# Patient Record
Sex: Male | Born: 1970 | Race: Black or African American | Hispanic: No | Marital: Single | Smoking: Never smoker
Health system: Southern US, Community
[De-identification: ages and names within clinical notes are randomized; demographics above are authoritative.]

## PROBLEM LIST (undated history)

## (undated) DIAGNOSIS — E785 Hyperlipidemia, unspecified: Secondary | ICD-10-CM

## (undated) DIAGNOSIS — E119 Type 2 diabetes mellitus without complications: Secondary | ICD-10-CM

## (undated) DIAGNOSIS — E7800 Pure hypercholesterolemia, unspecified: Secondary | ICD-10-CM

## (undated) DIAGNOSIS — R7303 Prediabetes: Secondary | ICD-10-CM

## (undated) DIAGNOSIS — I10 Essential (primary) hypertension: Secondary | ICD-10-CM

## (undated) HISTORY — PX: OTHER SURGICAL HISTORY: SHX169

## (undated) HISTORY — PX: BACK SURGERY: SHX140

## (undated) HISTORY — DX: Hyperlipidemia, unspecified: E78.5

## (undated) HISTORY — PX: ANKLE SURGERY: SHX546

---

## 1997-12-15 ENCOUNTER — Emergency Department (HOSPITAL_COMMUNITY): Admission: EM | Admit: 1997-12-15 | Discharge: 1997-12-15 | Payer: Self-pay | Admitting: Emergency Medicine

## 1998-07-11 ENCOUNTER — Encounter: Payer: Self-pay | Admitting: Emergency Medicine

## 1998-07-11 ENCOUNTER — Emergency Department (HOSPITAL_COMMUNITY): Admission: EM | Admit: 1998-07-11 | Discharge: 1998-07-11 | Payer: Self-pay | Admitting: Emergency Medicine

## 2015-01-12 ENCOUNTER — Emergency Department (HOSPITAL_BASED_OUTPATIENT_CLINIC_OR_DEPARTMENT_OTHER): Payer: Self-pay

## 2015-01-12 ENCOUNTER — Encounter (HOSPITAL_BASED_OUTPATIENT_CLINIC_OR_DEPARTMENT_OTHER): Payer: Self-pay | Admitting: *Deleted

## 2015-01-12 ENCOUNTER — Emergency Department (HOSPITAL_BASED_OUTPATIENT_CLINIC_OR_DEPARTMENT_OTHER)
Admission: EM | Admit: 2015-01-12 | Discharge: 2015-01-12 | Disposition: A | Discharge status: Home / Self Care | Payer: Self-pay | Attending: Emergency Medicine | Admitting: Emergency Medicine

## 2015-01-12 DIAGNOSIS — I10 Essential (primary) hypertension: Secondary | ICD-10-CM | POA: Insufficient documentation

## 2015-01-12 DIAGNOSIS — R079 Chest pain, unspecified: Secondary | ICD-10-CM | POA: Insufficient documentation

## 2015-01-12 DIAGNOSIS — Z79899 Other long term (current) drug therapy: Secondary | ICD-10-CM | POA: Insufficient documentation

## 2015-01-12 HISTORY — DX: Essential (primary) hypertension: I10

## 2015-01-12 LAB — CBC
HCT: 44.5 % (ref 39.0–52.0)
Hemoglobin: 14.6 g/dL (ref 13.0–17.0)
MCH: 28 pg (ref 26.0–34.0)
MCHC: 32.8 g/dL (ref 30.0–36.0)
MCV: 85.4 fL (ref 78.0–100.0)
PLATELETS: 273 10*3/uL (ref 150–400)
RBC: 5.21 MIL/uL (ref 4.22–5.81)
RDW: 13.6 % (ref 11.5–15.5)
WBC: 6.3 10*3/uL (ref 4.0–10.5)

## 2015-01-12 LAB — BASIC METABOLIC PANEL
Anion gap: 10 (ref 5–15)
BUN: 10 mg/dL (ref 6–20)
CALCIUM: 8.9 mg/dL (ref 8.9–10.3)
CHLORIDE: 103 mmol/L (ref 101–111)
CO2: 26 mmol/L (ref 22–32)
CREATININE: 1.05 mg/dL (ref 0.61–1.24)
GFR calc Af Amer: >60 mL/min (ref >60)
GFR calc non Af Amer: >60 mL/min (ref >60)
GLUCOSE: 109 mg/dL — AB (ref 65–99)
Potassium: 3.4 mmol/L — ABNORMAL LOW (ref 3.5–5.1)
Sodium: 139 mmol/L (ref 135–145)

## 2015-01-12 LAB — TROPONIN I: Troponin I: <0.03 ng/mL (ref <0.031)

## 2015-01-12 MED ORDER — AMLODIPINE BESYLATE 5 MG PO TABS
10.0000 mg | ORAL_TABLET | Freq: Once | ORAL | Status: AC
  Administered 2015-01-12: 10 mg via ORAL
  Filled 2015-01-12: qty 2

## 2015-01-12 MED ORDER — ASPIRIN 81 MG PO CHEW
324.0000 mg | CHEWABLE_TABLET | Freq: Once | ORAL | Status: AC
  Administered 2015-01-12: 324 mg via ORAL
  Filled 2015-01-12: qty 4

## 2015-01-12 NOTE — ED Provider Notes (Signed)
CSN: [id number]     Arrival date & time 01/12/15  1316 History   First MD Initiated Contact with Patient 01/12/15 1355     Chief Complaint  Patient presents with  . Chest Pain     (Consider location/radiation/quality/duration/timing/severity/associated sxs/prior Treatment) HPI  Pt presenting with c/o chest pain- he states the pain is described as an aching pressure type pain in both right and left chest.  He states the pain is intermittent.  Occurs more after eating and with movement.  No abdominal pain, no nausea associated, no radiation of pain, no diaphoresis.  No shortness of breath.  He states initially the pain began approx 2 months ago when he was throwing steel pipes into a trailer- he states he felt he may have pulled a muscle with the throwing motion. No hx of DVT/PE, no leg swelling. No hx of trauma/travel/surgery.  Chest pain is not associated with exertion he works in the heat and does not regularly feel the symptoms while working.  There are no other associated systemic symptoms, there are no other alleviating or modifying factors.   Past Medical History  Diagnosis Date  . Hypertension    History reviewed. No pertinent past surgical history. No family history on file. Social History  Substance Use Topics  . Smoking status: Never Smoker   . Smokeless tobacco: Never Used  . Alcohol Use: Yes     Comment: 14, 24 oz beers    Review of Systems  ROS reviewed and all otherwise negative except for mentioned in HPI    Allergies  Review of patient's allergies indicates no known allergies.  Home Medications   Prior to Admission medications   Medication Sig Start Date End Date Taking? Authorizing Provider  amLODipine (NORVASC) 10 MG tablet Take 10 mg by mouth daily.   Yes Historical Provider, MD   BP 154/99 mmHg  Pulse 83  Temp(Src) 98.5 F (36.9 C) (Oral)  Resp 15  Ht  (1.803 m)  Wt 245 lb (111.131 kg)  BMI 34.19 kg/m2  SpO2 99%  Vitals reviewed Physical  Exam  Physical Examination: General appearance - alert, well appearing, and in no distress Mental status - alert, oriented to person, place, and time Eyes - no conjunctival injection, no scleral icterus Mouth - mucous membranes moist, pharynx normal without lesions Neck - supple, no significant adenopathy Chest - clear to auscultation, no wheezes, rales or rhonchi, symmetric air entry Heart - normal rate, regular rhythm, normal S1, S2, no murmurs, rubs, clicks or gallops Abdomen - soft, nontender, nondistended, no masses or organomegaly Neurological - alert, oriented, normal speech Extremities - peripheral pulses normal, no pedal edema, no clubbing or cyanosis Skin - normal coloration and turgor, no rashes  ED Course  Procedures (including critical care time) Labs Review Labs Reviewed  BASIC METABOLIC PANEL - Abnormal; Notable for the following:    Potassium 3.4 (*)    Glucose, Bld 109 (*)    All other components within normal limits  CBC  TROPONIN I    Imaging Review Dg Chest 2 View  01/12/2015   CLINICAL DATA:  44 year old male with a history of chest pain.  EXAM: CHEST - 2 VIEW  COMPARISON:  None.  FINDINGS: Cardiomediastinal silhouette projects within normal limits in size and contour. Ill-defined airspace opacity in the right mid to lower lung. No pneumothorax or pleural effusion. Left lung relatively well aerated.  No displaced fracture.  Unremarkable appearance of the upper abdomen.  IMPRESSION: Airspace opacity in  the right mid to lower lung, which, in the absence of symptoms of infection/pneumonia, likely represents atelectasis given the history.  Signed,  Yvone Neu. Loreta Ave, DO  [facility]  [facility]   Electronically Signed   By: Gilmer Mor D.O.   On: 01/12/2015 14:38   I have personally reviewed and evaluated these images and lab results as part of my medical decision-making.   EKG Interpretation   Date/Time:  Sunday  January 12 2015 13:52:16 EDT Ventricular Rate:  95 PR Interval:  148 QRS Duration: 76 QT Interval:  354 QTC Calculation: 444 R Axis:   54 Text Interpretation:  Normal sinus rhythm Biatrial enlargement Abnormal  ECG No old tracing to compare Confirmed by [facility]  MD, MARTHA [phone]) on  01/12/2015 2:18:21 PM      MDM   Final diagnoses:  Essential hypertension  Chest pain, unspecified chest pain type     Pt has a heart score of 1 making him low risk for ACS, EKg and troponin are reassuring.  Will obtain second troponin at 5pm.  Pt PERC 0, making him very low risk for PE.  Pt signed out at end of shift to Dr. Judd Lien pending second troponin at 5pm- if this is negative patient to be discharge.  He was given his home dose of norvasc in the ED as he has not taken today.  He was encouraged to take his BP meds regularly.    Jerelyn Scott, MD 01/12/15 1556

## 2015-01-12 NOTE — Discharge Instructions (Signed)

## 2015-01-12 NOTE — ED Notes (Signed)
Pt reports that on July 5th he was loading a trailer with steel pipes and started experiencing chest pain (no radiation or other associated symptoms). Chest pain has been intermittent ever since then. Denies taking pain meds, fever, n/v/d, sob.

## 2017-12-07 DIAGNOSIS — G4730 Sleep apnea, unspecified: Secondary | ICD-10-CM | POA: Insufficient documentation

## 2017-12-07 HISTORY — DX: Sleep apnea, unspecified: G47.30

## 2018-01-30 ENCOUNTER — Encounter (HOSPITAL_BASED_OUTPATIENT_CLINIC_OR_DEPARTMENT_OTHER): Payer: Self-pay

## 2018-01-30 ENCOUNTER — Emergency Department (HOSPITAL_BASED_OUTPATIENT_CLINIC_OR_DEPARTMENT_OTHER): Payer: Self-pay

## 2018-01-30 ENCOUNTER — Emergency Department (HOSPITAL_BASED_OUTPATIENT_CLINIC_OR_DEPARTMENT_OTHER)
Admission: EM | Admit: 2018-01-30 | Discharge: 2018-01-31 | Disposition: A | Discharge status: Home / Self Care | Payer: Self-pay | Attending: Emergency Medicine | Admitting: Emergency Medicine

## 2018-01-30 ENCOUNTER — Other Ambulatory Visit: Payer: Self-pay

## 2018-01-30 DIAGNOSIS — E876 Hypokalemia: Secondary | ICD-10-CM | POA: Insufficient documentation

## 2018-01-30 DIAGNOSIS — R002 Palpitations: Secondary | ICD-10-CM | POA: Insufficient documentation

## 2018-01-30 DIAGNOSIS — Z79899 Other long term (current) drug therapy: Secondary | ICD-10-CM | POA: Insufficient documentation

## 2018-01-30 LAB — D-DIMER, QUANTITATIVE: D-Dimer, Quant: 0.32 ug/mL-FEU (ref 0.00–0.50)

## 2018-01-30 LAB — BASIC METABOLIC PANEL
Anion gap: 12 (ref 5–15)
BUN: 13 mg/dL (ref 6–20)
CO2: 23 mmol/L (ref 22–32)
CREATININE: 0.91 mg/dL (ref 0.61–1.24)
Calcium: 8.9 mg/dL (ref 8.9–10.3)
Chloride: 103 mmol/L (ref 98–111)
GFR calc Af Amer: >60 mL/min (ref >60)
GFR calc non Af Amer: >60 mL/min (ref >60)
Glucose, Bld: 134 mg/dL — ABNORMAL HIGH (ref 70–99)
Potassium: 3.3 mmol/L — ABNORMAL LOW (ref 3.5–5.1)
SODIUM: 138 mmol/L (ref 135–145)

## 2018-01-30 LAB — TROPONIN I: Troponin I: <0.03 ng/mL (ref <0.03)

## 2018-01-30 LAB — CBC
HCT: 44.3 % (ref 39.0–52.0)
Hemoglobin: 14.6 g/dL (ref 13.0–17.0)
MCH: 28.9 pg (ref 26.0–34.0)
MCHC: 33 g/dL (ref 30.0–36.0)
MCV: 87.5 fL (ref 78.0–100.0)
PLATELETS: 228 10*3/uL (ref 150–400)
RBC: 5.06 MIL/uL (ref 4.22–5.81)
RDW: 13.9 % (ref 11.5–15.5)
WBC: 5.8 10*3/uL (ref 4.0–10.5)

## 2018-01-30 MED ORDER — POTASSIUM CHLORIDE CRYS ER 20 MEQ PO TBCR
40.0000 meq | EXTENDED_RELEASE_TABLET | Freq: Once | ORAL | Status: AC
  Administered 2018-01-30: 40 meq via ORAL
  Filled 2018-01-30: qty 2

## 2018-01-30 NOTE — ED Triage Notes (Signed)
Left side chest pain since this morning, started after he was walking around the neighborhood, denies SOB, denies nausea, denies jaw pain

## 2018-01-30 NOTE — ED Provider Notes (Signed)
[facility] EMERGENCY DEPARTMENT Provider Note   CSN: [id number] Arrival date & time: 01/30/18  2144     History   Chief Complaint Chief Complaint  Patient presents with  . Chest Pain    HPI VINH BLOOM is a 47 y.o. male.  HPI  This is a 47 year old male with a history of hypertension who presents with palpitations.  Patient reports that he normally takes walks in the morning.  This morning he was out walking when he had onset of palpitations.  Denies any pain but states he felt like his heart was fluttering.  This lasts for several minutes and subsided.  No associated nausea or shortness of breath.  It resolved on its own.  He states throughout the day he has noted intermittent recurrent episodes throughout the day.  Not associated with exertion or eating.  Denies any recent major changes in diet, medications, denies supplements.  Does report "my mom has some sort of fluttering in her heart."  Denies any history of diabetes, smoking, early family history of heart disease.  Denies any leg swelling, history of blood clots, recent long travel, recent hospitalization.  Past Medical History:  Diagnosis Date  . Hypertension     There are no active problems to display for this patient.   History reviewed. No pertinent surgical history.      Home Medications    Prior to Admission medications   Medication Sig Start Date End Date Taking? Authorizing Provider  hydrochlorothiazide (HYDRODIURIL) 25 MG tablet Take by mouth. 10/20/17  Yes [provider]  lisinopril (PRINIVIL,ZESTRIL) 20 MG tablet Take by mouth. 11/25/17  Yes [provider]  amLODipine (NORVASC) 10 MG tablet Take 10 mg by mouth daily.    [provider]  potassium chloride SA (K-DUR,KLOR-CON) 20 MEQ tablet Take 2 tablets (40 mEq total) by mouth daily. 01/31/18   Diamond Martucci, Mayer Masker, MD    Family History No family history on file.  Social History Social History   Tobacco Use  .  Smoking status: Never Smoker  . Smokeless tobacco: Never Used  Substance Use Topics  . Alcohol use: Yes    Comment: 14, 24 oz beers  . Drug use: No     Allergies   Patient has no known allergies.   Review of Systems Review of Systems  Constitutional: Negative for fever.  Respiratory: Negative for shortness of breath.   Cardiovascular: Positive for palpitations. Negative for chest pain and leg swelling.  Gastrointestinal: Negative for abdominal pain, nausea and vomiting.  Genitourinary: Negative for dysuria.  All other systems reviewed and are negative.    Physical Exam Updated Vital Signs BP 124/75   Pulse 81   Temp 98.8 F (37.1 C) (Oral)   Resp 16   Ht 1.803 m (5\' 11" )   Wt 113.4 kg   SpO2 98%   BMI 34.87 kg/m   Physical Exam  Constitutional: He is oriented to person, place, and time. He appears well-developed and well-nourished.  Nontoxic-appearing, overweight  HENT:  Head: Normocephalic and atraumatic.  Eyes: Pupils are equal, round, and reactive to light.  Cardiovascular: Normal rate, regular rhythm, normal heart sounds and normal pulses.  No murmur heard. Pulmonary/Chest: Effort normal and breath sounds normal. No respiratory distress. He has no wheezes.  Abdominal: Soft. Bowel sounds are normal. There is no tenderness. There is no rebound.  Musculoskeletal:       Right lower leg: He exhibits edema. He exhibits no tenderness.  Left lower leg: He exhibits edema. He exhibits no tenderness.  1+ symmetric bilateral lower extremity edema  Lymphadenopathy:    He has no cervical adenopathy.  Neurological: He is alert and oriented to person, place, and time.  Skin: Skin is warm and dry.  Psychiatric: He has a normal mood and affect.  Nursing note and vitals reviewed.    ED Treatments / Results  Labs (all labs ordered are listed, but only abnormal results are displayed) Labs Reviewed  BASIC METABOLIC PANEL - Abnormal; Notable for the following  components:      Result Value   Potassium 3.3 (*)    Glucose, Bld 134 (*)    All other components within normal limits  CBC  TROPONIN I  D-DIMER, QUANTITATIVE (NOT AT [facility])    EKG EKG Interpretation  Date/Time:  Monday January 30 2018 21:53:40 EDT Ventricular Rate:  87 PR Interval:  146 QRS Duration: 84 QT Interval:  354 QTC Calculation: 425 R Axis:   50 Text Interpretation:  Normal sinus rhythm Right atrial enlargement Borderline ECG No significant change since last tracing Confirmed by Ross Marcus ([id number]) on 01/30/2018 11:01:02 PM   Radiology Dg Chest 2 View  Result Date: 01/30/2018 CLINICAL DATA:  Chest pain EXAM: CHEST - 2 VIEW COMPARISON:  01/12/2015 FINDINGS: Heart is mildly enlarged. Normal vascularity. Low lung volumes and bibasilar atelectasis. No pneumothorax or pleural effusion. IMPRESSION: Cardiomegaly without decompensation. Electronically Signed   By: Jolaine Click M.D.   On: 01/30/2018 22:52    Procedures Procedures (including critical care time)  Medications Ordered in ED Medications  potassium chloride SA (K-DUR,KLOR-CON) CR tablet 40 mEq (40 mEq Oral Given 01/30/18 2351)     Initial Impression / Assessment and Plan / ED Course  I have reviewed the triage vital signs and the nursing notes.  Pertinent labs & imaging results that were available during my care of the patient were reviewed by me and considered in my medical decision making (see chart for details).     Patient presents with palpitations.  Initially started this morning after walking and intermittent throughout the day.  Denies any overt chest pain or shortness of breath.  He is overall nontoxic-appearing and vital signs are reassuring.  Heart score is 2.  EKG shows no signs of active ischemia.  Troponin is negative.  Chest x-ray shows no evidence of pneumothorax or pneumonia.  He is low risk for PE and has a negative d-dimer which rules him out.  He has had no evidence of arrhythmia while in  the emergency department.  He does have some mild hypokalemia.  This was replaced and he will be supplemented for the next 5 days.  Given intermittent palpitations, would recommend cardiology follow-up for Holter monitoring.  I discussed with the patient.  He is agreeable to plan.  After history, exam, and medical workup I feel the patient has been appropriately medically screened and is safe for discharge home. Pertinent diagnoses were discussed with the patient. Patient was given return precautions.   Final Clinical Impressions(s) / ED Diagnoses   Final diagnoses:  Palpitations  Hypokalemia    ED Discharge Orders         Ordered    potassium chloride SA (K-DUR,KLOR-CON) 20 MEQ tablet  Daily     01/31/18 0023           Shon Baton, MD 01/31/18 [phone]

## 2018-01-31 MED ORDER — POTASSIUM CHLORIDE CRYS ER 20 MEQ PO TBCR: 40.0000 meq | EXTENDED_RELEASE_TABLET | Freq: Every day | ORAL | 0 refills | Status: DC

## 2018-01-31 NOTE — Discharge Instructions (Addendum)
He was seen today for palpitations and fluttering.  Your work-up is reassuring.  Avoid excessive caffeine or drastic changes in diet.  Make sure to take potassium supplements and increase potassium in your diet.  Follow-up with cardiology for Holter monitoring.  If you develop any new or worsening symptoms you should be reevaluated.

## 2018-05-26 ENCOUNTER — Emergency Department (HOSPITAL_BASED_OUTPATIENT_CLINIC_OR_DEPARTMENT_OTHER)
Admission: EM | Admit: 2018-05-26 | Discharge: 2018-05-26 | Disposition: A | Discharge status: Home / Self Care | Payer: Self-pay | Attending: Emergency Medicine | Admitting: Emergency Medicine

## 2018-05-26 ENCOUNTER — Emergency Department (HOSPITAL_BASED_OUTPATIENT_CLINIC_OR_DEPARTMENT_OTHER): Payer: Self-pay

## 2018-05-26 ENCOUNTER — Encounter (HOSPITAL_BASED_OUTPATIENT_CLINIC_OR_DEPARTMENT_OTHER): Payer: Self-pay | Admitting: Emergency Medicine

## 2018-05-26 ENCOUNTER — Other Ambulatory Visit: Payer: Self-pay

## 2018-05-26 DIAGNOSIS — I10 Essential (primary) hypertension: Secondary | ICD-10-CM | POA: Insufficient documentation

## 2018-05-26 DIAGNOSIS — R51 Headache: Secondary | ICD-10-CM | POA: Insufficient documentation

## 2018-05-26 DIAGNOSIS — R410 Disorientation, unspecified: Secondary | ICD-10-CM | POA: Insufficient documentation

## 2018-05-26 DIAGNOSIS — Z79899 Other long term (current) drug therapy: Secondary | ICD-10-CM | POA: Insufficient documentation

## 2018-05-26 DIAGNOSIS — R519 Headache, unspecified: Secondary | ICD-10-CM
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
## 2018-05-26 NOTE — ED Triage Notes (Signed)
Pt states he had a pain in his face about 20 minutes ago  Pt denies pain at this time but states his forehead feels tingly  Pt says he has never felt anything like that before

## 2018-05-26 NOTE — ED Notes (Signed)
Pt verbalizes understanding of d/c instructions and denies any further needs at this time. 

## 2018-05-26 NOTE — ED Notes (Signed)
Patient transported to CT 

## 2019-05-30 ENCOUNTER — Other Ambulatory Visit: Payer: Self-pay

## 2019-05-30 DIAGNOSIS — Z20822 Contact with and (suspected) exposure to covid-19: Secondary | ICD-10-CM

## 2019-06-01 LAB — NOVEL CORONAVIRUS, NAA: SARS-CoV-2, NAA: DETECTED — AB

## 2019-06-02 ENCOUNTER — Telehealth: Payer: Self-pay | Admitting: Adult Health

## 2019-06-02 NOTE — Telephone Encounter (Signed)
Patient called about Positive Covid test.  2 patient identifiers confirmed.  Date Tested: 05/29/2018  Date of Symptom onset: 05/27/2018   Symptoms: Mild Mild--Fever greater than 100.4, fatigue, body aches, dry cough, and DOE  Isolation Recommendations:  Patient understands the needs to stay in isolation for a total of 10 days from onset of symptom or 14 days total from date of testing if no symptom. Reviewed Masking.    Supportive Care Recommendations: Encouraged plenty of fluid intake, anti-pyretics per package directions, and to remain as active as possible.    Patient knows the health department may be in touch.    I answered all of patient's questions and all concerns addressed.  Gave information on My chart.    Time Spent: 5 minutes on phone  Lillard Anes, NP

## 2019-06-04 ENCOUNTER — Encounter (HOSPITAL_BASED_OUTPATIENT_CLINIC_OR_DEPARTMENT_OTHER): Payer: Self-pay | Admitting: Emergency Medicine

## 2019-06-04 ENCOUNTER — Inpatient Hospital Stay (HOSPITAL_BASED_OUTPATIENT_CLINIC_OR_DEPARTMENT_OTHER)
Admission: EM | Admit: 2019-06-04 | Discharge: 2019-06-06 | DRG: 177 | Disposition: A | Discharge status: Home / Self Care | Payer: HRSA Program | Attending: Internal Medicine | Admitting: Internal Medicine

## 2019-06-04 ENCOUNTER — Other Ambulatory Visit: Payer: Self-pay

## 2019-06-04 ENCOUNTER — Emergency Department (HOSPITAL_BASED_OUTPATIENT_CLINIC_OR_DEPARTMENT_OTHER): Payer: HRSA Program

## 2019-06-04 DIAGNOSIS — U071 COVID-19: Secondary | ICD-10-CM | POA: Diagnosis present

## 2019-06-04 DIAGNOSIS — Z8249 Family history of ischemic heart disease and other diseases of the circulatory system: Secondary | ICD-10-CM | POA: Diagnosis not present

## 2019-06-04 DIAGNOSIS — I10 Essential (primary) hypertension: Secondary | ICD-10-CM | POA: Diagnosis present

## 2019-06-04 DIAGNOSIS — J9601 Acute respiratory failure with hypoxia: Secondary | ICD-10-CM | POA: Diagnosis present

## 2019-06-04 DIAGNOSIS — J1282 Pneumonia due to coronavirus disease 2019: Secondary | ICD-10-CM | POA: Diagnosis present

## 2019-06-04 DIAGNOSIS — E662 Morbid (severe) obesity with alveolar hypoventilation: Secondary | ICD-10-CM | POA: Diagnosis present

## 2019-06-04 DIAGNOSIS — Z6834 Body mass index (BMI) 34.0-34.9, adult: Secondary | ICD-10-CM | POA: Diagnosis not present

## 2019-06-04 DIAGNOSIS — Z79899 Other long term (current) drug therapy: Secondary | ICD-10-CM

## 2019-06-04 HISTORY — DX: Morbid (severe) obesity with alveolar hypoventilation: E66.2

## 2019-06-04 HISTORY — DX: COVID-19: U07.1

## 2019-06-04 LAB — COMPREHENSIVE METABOLIC PANEL
ALT: 37 U/L (ref 0–44)
AST: 39 U/L (ref 15–41)
Albumin: 3.9 g/dL (ref 3.5–5.0)
Alkaline Phosphatase: 48 U/L (ref 38–126)
Anion gap: 11 (ref 5–15)
BUN: 16 mg/dL (ref 6–20)
CO2: 25 mmol/L (ref 22–32)
Calcium: 8.7 mg/dL — ABNORMAL LOW (ref 8.9–10.3)
Chloride: 97 mmol/L — ABNORMAL LOW (ref 98–111)
Creatinine, Ser: 1.06 mg/dL (ref 0.61–1.24)
GFR calc Af Amer: >60 mL/min (ref >60)
GFR calc non Af Amer: >60 mL/min (ref >60)
Glucose, Bld: 113 mg/dL — ABNORMAL HIGH (ref 70–99)
Potassium: 3.7 mmol/L (ref 3.5–5.1)
Sodium: 133 mmol/L — ABNORMAL LOW (ref 135–145)
Total Bilirubin: 0.8 mg/dL (ref 0.3–1.2)
Total Protein: 8.3 g/dL — ABNORMAL HIGH (ref 6.5–8.1)

## 2019-06-04 LAB — CBC WITH DIFFERENTIAL/PLATELET
Abs Immature Granulocytes: 0.03 10*3/uL (ref 0.00–0.07)
Basophils Absolute: 0 10*3/uL (ref 0.0–0.1)
Basophils Relative: 0 %
Eosinophils Absolute: 0 10*3/uL (ref 0.0–0.5)
Eosinophils Relative: 0 %
HCT: 39.5 % (ref 39.0–52.0)
Hemoglobin: 12.7 g/dL — ABNORMAL LOW (ref 13.0–17.0)
Immature Granulocytes: 1 %
Lymphocytes Relative: 33 %
Lymphs Abs: 1.8 10*3/uL (ref 0.7–4.0)
MCH: 28 pg (ref 26.0–34.0)
MCHC: 32.2 g/dL (ref 30.0–36.0)
MCV: 87 fL (ref 80.0–100.0)
Monocytes Absolute: 0.6 10*3/uL (ref 0.1–1.0)
Monocytes Relative: 10 %
Neutro Abs: 3 10*3/uL (ref 1.7–7.7)
Neutrophils Relative %: 56 %
Platelets: 241 10*3/uL (ref 150–400)
RBC: 4.54 MIL/uL (ref 4.22–5.81)
RDW: 12.8 % (ref 11.5–15.5)
WBC: 5.4 10*3/uL (ref 4.0–10.5)
nRBC: 0 % (ref 0.0–0.2)

## 2019-06-04 LAB — D-DIMER, QUANTITATIVE: D-Dimer, Quant: 0.5 ug/mL-FEU (ref 0.00–0.50)

## 2019-06-04 LAB — PROCALCITONIN: Procalcitonin: <0.1 ng/mL

## 2019-06-04 LAB — C-REACTIVE PROTEIN: CRP: 11.2 mg/dL — ABNORMAL HIGH (ref <1.0)

## 2019-06-04 LAB — LACTIC ACID, PLASMA: Lactic Acid, Venous: 1 mmol/L (ref 0.5–1.9)

## 2019-06-04 LAB — TRIGLYCERIDES: Triglycerides: 102 mg/dL (ref <150)

## 2019-06-04 LAB — LACTATE DEHYDROGENASE: LDH: 256 U/L — ABNORMAL HIGH (ref 98–192)

## 2019-06-04 LAB — FERRITIN: Ferritin: 370 ng/mL — ABNORMAL HIGH (ref 24–336)

## 2019-06-04 MED ORDER — HYDROCOD POLST-CPM POLST ER 10-8 MG/5ML PO SUER: 5.0000 mL | Freq: Two times a day (BID) | ORAL | Status: DC | PRN

## 2019-06-04 MED ORDER — ASCORBIC ACID 500 MG PO TABS
500.0000 mg | ORAL_TABLET | Freq: Every day | ORAL | Status: DC
  Administered 2019-06-05 – 2019-06-06 (×2): 500 mg via ORAL
  Filled 2019-06-04 (×2): qty 1

## 2019-06-04 MED ORDER — SODIUM CHLORIDE 0.9 % IV SOLN
100.0000 mg | INTRAVENOUS | Status: AC
  Administered 2019-06-04 (×2): 100 mg via INTRAVENOUS
  Filled 2019-06-04: qty 20

## 2019-06-04 MED ORDER — DOCUSATE SODIUM 100 MG PO CAPS
100.0000 mg | ORAL_CAPSULE | Freq: Every day | ORAL | Status: DC
  Administered 2019-06-05 – 2019-06-06 (×2): 100 mg via ORAL
  Filled 2019-06-04 (×2): qty 1

## 2019-06-04 MED ORDER — DEXAMETHASONE 6 MG PO TABS
6.0000 mg | ORAL_TABLET | Freq: Every day | ORAL | Status: DC
  Administered 2019-06-05 – 2019-06-06 (×2): 6 mg via ORAL
  Filled 2019-06-04 (×2): qty 1

## 2019-06-04 MED ORDER — ENOXAPARIN SODIUM 60 MG/0.6ML ~~LOC~~ SOLN
55.0000 mg | Freq: Every day | SUBCUTANEOUS | Status: DC
  Administered 2019-06-05 (×2): 55 mg via SUBCUTANEOUS
  Filled 2019-06-04: qty 0.6

## 2019-06-04 MED ORDER — ZINC SULFATE 220 (50 ZN) MG PO CAPS
220.0000 mg | ORAL_CAPSULE | Freq: Every day | ORAL | Status: DC
  Administered 2019-06-05 – 2019-06-06 (×2): 220 mg via ORAL
  Filled 2019-06-04 (×2): qty 1

## 2019-06-04 MED ORDER — ACETAMINOPHEN 325 MG PO TABS: 650.0000 mg | ORAL_TABLET | Freq: Four times a day (QID) | ORAL | Status: DC | PRN

## 2019-06-04 MED ORDER — IPRATROPIUM-ALBUTEROL 20-100 MCG/ACT IN AERS
1.0000 | INHALATION_SPRAY | Freq: Four times a day (QID) | RESPIRATORY_TRACT | Status: DC
  Administered 2019-06-05 – 2019-06-06 (×6): 1 via RESPIRATORY_TRACT
  Filled 2019-06-04: qty 4

## 2019-06-04 MED ORDER — SODIUM CHLORIDE 0.9 % IV SOLN
100.0000 mg | Freq: Every day | INTRAVENOUS | Status: DC
  Administered 2019-06-05 – 2019-06-06 (×2): 100 mg via INTRAVENOUS
  Filled 2019-06-04 (×3): qty 20

## 2019-06-04 MED ORDER — GUAIFENESIN-DM 100-10 MG/5ML PO SYRP: 10.0000 mL | ORAL_SOLUTION | ORAL | Status: DC | PRN

## 2019-06-04 MED ORDER — DEXAMETHASONE 6 MG PO TABS
6.0000 mg | ORAL_TABLET | Freq: Once | ORAL | Status: AC
  Administered 2019-06-04: 6 mg via ORAL
  Filled 2019-06-04: qty 1

## 2019-06-04 NOTE — ED Notes (Signed)
Attempted report once again to [phone], not able to leave message. Will attempt different number to call report.
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
## 2019-06-04 NOTE — ED Notes (Signed)
Attempted report to floor nurse, unable to leave message. Will attempt report once again.

## 2019-06-05 ENCOUNTER — Encounter (HOSPITAL_COMMUNITY): Payer: Self-pay | Admitting: Internal Medicine

## 2019-06-05 DIAGNOSIS — J9601 Acute respiratory failure with hypoxia: Secondary | ICD-10-CM

## 2019-06-05 DIAGNOSIS — U071 COVID-19: Principal | ICD-10-CM

## 2019-06-05 LAB — CBC WITH DIFFERENTIAL/PLATELET
Abs Immature Granulocytes: 0.03 10*3/uL (ref 0.00–0.07)
Basophils Absolute: 0 10*3/uL (ref 0.0–0.1)
Basophils Relative: 0 %
Eosinophils Absolute: 0 10*3/uL (ref 0.0–0.5)
Eosinophils Relative: 0 %
HCT: 40.5 % (ref 39.0–52.0)
Hemoglobin: 12.8 g/dL — ABNORMAL LOW (ref 13.0–17.0)
Immature Granulocytes: 1 %
Lymphocytes Relative: 38 %
Lymphs Abs: 1.9 10*3/uL (ref 0.7–4.0)
MCH: 27.8 pg (ref 26.0–34.0)
MCHC: 31.6 g/dL (ref 30.0–36.0)
MCV: 87.9 fL (ref 80.0–100.0)
Monocytes Absolute: 0.3 10*3/uL (ref 0.1–1.0)
Monocytes Relative: 7 %
Neutro Abs: 2.8 10*3/uL (ref 1.7–7.7)
Neutrophils Relative %: 54 %
Platelets: 282 10*3/uL (ref 150–400)
RBC: 4.61 MIL/uL (ref 4.22–5.81)
RDW: 13.2 % (ref 11.5–15.5)
WBC: 5.1 10*3/uL (ref 4.0–10.5)
nRBC: 0 % (ref 0.0–0.2)

## 2019-06-05 LAB — COMPREHENSIVE METABOLIC PANEL
ALT: 60 U/L — ABNORMAL HIGH (ref 0–44)
AST: 63 U/L — ABNORMAL HIGH (ref 15–41)
Albumin: 3.9 g/dL (ref 3.5–5.0)
Alkaline Phosphatase: 55 U/L (ref 38–126)
Anion gap: 14 (ref 5–15)
BUN: 16 mg/dL (ref 6–20)
CO2: 25 mmol/L (ref 22–32)
Calcium: 8.8 mg/dL — ABNORMAL LOW (ref 8.9–10.3)
Chloride: 96 mmol/L — ABNORMAL LOW (ref 98–111)
Creatinine, Ser: 0.81 mg/dL (ref 0.61–1.24)
GFR calc Af Amer: >60 mL/min (ref >60)
GFR calc non Af Amer: >60 mL/min (ref >60)
Glucose, Bld: 123 mg/dL — ABNORMAL HIGH (ref 70–99)
Potassium: 4.1 mmol/L (ref 3.5–5.1)
Sodium: 135 mmol/L (ref 135–145)
Total Bilirubin: 0.7 mg/dL (ref 0.3–1.2)
Total Protein: 8.6 g/dL — ABNORMAL HIGH (ref 6.5–8.1)

## 2019-06-05 LAB — HIV ANTIBODY (ROUTINE TESTING W REFLEX): HIV Screen 4th Generation wRfx: NONREACTIVE

## 2019-06-05 LAB — C-REACTIVE PROTEIN: CRP: 8.6 mg/dL — ABNORMAL HIGH (ref <1.0)

## 2019-06-05 LAB — FERRITIN: Ferritin: 392 ng/mL — ABNORMAL HIGH (ref 24–336)

## 2019-06-05 LAB — D-DIMER, QUANTITATIVE: D-Dimer, Quant: 0.47 ug/mL-FEU (ref 0.00–0.50)

## 2019-06-05 LAB — FIBRINOGEN: Fibrinogen: 757 mg/dL — ABNORMAL HIGH (ref 210–475)

## 2019-06-05 MED ORDER — AMLODIPINE BESYLATE 10 MG PO TABS
10.0000 mg | ORAL_TABLET | Freq: Every day | ORAL | Status: DC
  Administered 2019-06-05 – 2019-06-06 (×2): 10 mg via ORAL
  Filled 2019-06-05 (×2): qty 1

## 2019-06-05 MED ORDER — HYDROCHLOROTHIAZIDE 25 MG PO TABS
25.0000 mg | ORAL_TABLET | Freq: Every day | ORAL | Status: DC
  Administered 2019-06-05 – 2019-06-06 (×2): 25 mg via ORAL
  Filled 2019-06-05 (×2): qty 1

## 2019-06-05 MED ORDER — LISINOPRIL 20 MG PO TABS
20.0000 mg | ORAL_TABLET | Freq: Every day | ORAL | Status: DC
  Administered 2019-06-05 – 2019-06-06 (×2): 20 mg via ORAL
  Filled 2019-06-05 (×2): qty 1

## 2019-06-05 MED ORDER — ATORVASTATIN CALCIUM 10 MG PO TABS
20.0000 mg | ORAL_TABLET | Freq: Every day | ORAL | Status: DC
  Administered 2019-06-05 – 2019-06-06 (×3): 20 mg via ORAL
  Filled 2019-06-05 (×2): qty 2

## 2019-06-05 NOTE — Progress Notes (Signed)
SATURATION QUALIFICATIONS: (This note is used to comply with regulatory documentation for home oxygen)  Patient Saturations on Room Air at Rest = 96 %  Patient Saturations on Room Air while Ambulating = 92 %  

## 2019-06-05 NOTE — Progress Notes (Signed)
Pt declined family notification at this time.

## 2019-06-05 NOTE — Progress Notes (Signed)
PROGRESS NOTE  Brief Narrative: David Wise is a 49 y.o. male with a history of obesity, HTN who presented to [facility] ED with shortness of breath. He was working with someone who was not wearing a mask and had a cough about a week prior to developing a dry cough and fever and testing positive for covid-19 with PCR 1/6. Due to dyspnea, he came to the ED 1/11 where he was found to be hypoxic requiring 2L O2 at rest. CXR showed multifocal infiltrates. CRP elevated at 11.2, with normal PCT and d-dimer. Remdesivir and decadron were given and the patient ultimately transferred for admission to [facility] this morning.   Subjective: Reports significant improvement in dyspnea, still some cough. Walked in the hall with limited dyspnea and SpO2 nadir 88%. He has no other complaints, no abd pain.   Objective: BP 133/84 (BP Location: Right Arm)   Pulse 78   Temp 98.1 F (36.7 C) (Oral)   Resp 19   Ht 5\' 11"  (1.803 m)   Wt 112 kg   SpO2 93%   BMI 34.45 kg/m   Gen: Obese, pleasant male in no distress Pulm: Clear and nonlabored on 2L O2.   CV: RRR, no murmur, no JVD, no edema GI: Soft, NT, ND, +BS  Neuro: Alert and oriented. No focal deficits. Skin: No rashes, lesions or ulcers on visualized skin.  Assessment & Plan: Acute hypoxemic respiratory failure due to covid-19 pneumonia: SARS-CoV-2 PCR positive 1/6, in 2nd week of illness with elevated inflammatory markers and CXR infiltrates. CRP remains >7.  - Requires remdesivir x5 days (1/11 - 1/15). Will need LFT monitoring with slight bump in ALT noted today. Recheck CRP and ALT in AM. If ALT not grossly elevated in AM, will repeat ambulatory pulse oximetry and likely discharge 1/13 with plans to return for remdesivir in infusion clinic.  - Steroids x10 days - Vitamin C, zinc - Encourage OOB, IS - Tylenol and antitussives prn - Continue airborne, contact precautions while admitted. Isolation period would be recommended for 21 days now that he has been admitted  to the hospital. - Enoxaparin weight-based prophylactic dose.  - Maintain euvolemia/net negative.  - Avoid NSAIDs   HTN: Continue home medications  2/13, MD Pager on amion 06/05/2019, 10:14 AM

## 2019-06-05 NOTE — Plan of Care (Signed)
No acute events during this shift. Pt on 2LNC. Pt is ambulatory. VSS   Problem: Education: Goal: Knowledge of risk factors and measures for prevention of condition will improve 06/05/2019 0452 by Colin Benton, Abir Eroh, RN Outcome: Progressing 06/05/2019 0451 by Colin Benton, Sibel Khurana, RN Outcome: Progressing   Problem: Coping: Goal: Psychosocial and spiritual needs will be supported 06/05/2019 0452 by Colin Benton, Shayana Hornstein, RN Outcome: Progressing 06/05/2019 0451 by Helene Shoe, RN Outcome: Progressing   Problem: Respiratory: Goal: Will maintain a patent airway 06/05/2019 0452 by Helene Shoe, RN Outcome: Progressing 06/05/2019 0451 by Helene Shoe, RN Outcome: Progressing Goal: Complications related to the disease process, condition or treatment will be avoided or minimized 06/05/2019 0452 by Colin Benton, Klaryssa Fauth, RN Outcome: Progressing 06/05/2019 0451 by Helene Shoe, RN Outcome: Progressing

## 2019-06-06 LAB — ALT: ALT: 76 U/L — ABNORMAL HIGH (ref 0–44)

## 2019-06-06 LAB — C-REACTIVE PROTEIN: CRP: 4.1 mg/dL — ABNORMAL HIGH (ref <1.0)

## 2019-06-06 MED ORDER — PREDNISONE 10 MG PO TABS: ORAL_TABLET | ORAL | 0 refills | Status: AC

## 2019-06-06 NOTE — Progress Notes (Signed)
D/c instructions provided to pt. Pt follow up appointments for infusion clinic highlighted and explained to pt. He verbalizes understanding. All questions answered, pt has no other questions at this time.  Pt awaiting for ride.

## 2019-06-06 NOTE — Progress Notes (Signed)
Patient scheduled for outpatient Remdesivir infusion at 1PM on Thursday 1/14, and Friday 1/15.  Please advise them to report to [facility] at [address].  Drive to the security guard and tell them you are here for an infusion. They will direct you to the front entrance where we will come and get you.  For questions call [phone].  Thanks

## 2019-06-06 NOTE — Discharge Summary (Signed)
Physician Discharge Summary  David Wise KVQ:[id number] DOB: 1971-03-11 DOA: 06/04/2019  PCP: Patient, No Pcp Per  Admit date: 06/04/2019 Discharge date: 06/06/2019  Admitted From: Home Disposition: Home  Recommendations for Outpatient Follow-up:  1. Follow up with PCP in 1-2 weeks 2. Please obtain BMP/CBC in one week  Discharge Condition: Stable CODE STATUS: Full Diet recommendation: As tolerated  Brief/Interim Summary: David Wise is a 49 y.o. male with a history of obesity, HTN who presented to [facility] ED with shortness of breath. He was working with someone who was not wearing a mask and had a cough about a week prior to developing a dry cough and fever and testing positive for covid-19 with PCR 1/6. Due to dyspnea, he came to the ED 1/11 where he was found to be hypoxic requiring 2L O2 at rest. CXR showed multifocal infiltrates. CRP elevated at 11.2, with normal PCT and d-dimer.  Patient admitted as above with acute hypoxic respiratory failure noted to be COVID-19 positive.  Patient's labs continue downtrending, no longer requiring oxygen.  Patient does need an additional 2 doses of remdesivir, and is currently being set up to follow with the outpatient infusion center for doses on 06/07/2019 and 06/08/2019.  Patient otherwise stable and agreeable for discharge.  Will discharge on remainder of steroid taper as well as encouraged continued quarantine for 21 days as discussed from Covid swab.  Patient otherwise feels quite well back to baseline and is requesting discharge home which is certainly reasonable given resolution of symptoms and current improvement.  Discharge Diagnoses:  Principal Problem:   Acute hypoxemic respiratory failure due to severe acute respiratory syndrome coronavirus 2 (SARS-CoV-2) disease (HCC) Active Problems:   Pneumonia due to COVID-19 virus   Obesity hypoventilation syndrome (HCC)   Essential hypertension  Discharge Instructions   Allergies as of 06/06/2019    No Known Allergies     Medication List    STOP taking these medications   potassium chloride SA 20 MEQ tablet Commonly known as: KLOR-CON     TAKE these medications   amLODipine 10 MG tablet Commonly known as: NORVASC Take 10 mg by mouth daily.   atorvastatin 20 MG tablet Commonly known as: LIPITOR Take 20 mg by mouth daily.   hydrochlorothiazide 25 MG tablet Commonly known as: HYDRODIURIL Take 25 mg by mouth daily.   lisinopril 20 MG tablet Commonly known as: ZESTRIL Take 20 mg by mouth daily.   predniSONE 10 MG tablet Commonly known as: DELTASONE Take 4 tablets (40 mg total) by mouth daily for 3 days, THEN 3 tablets (30 mg total) daily for 3 days, THEN 2 tablets (20 mg total) daily for 3 days, THEN 1 tablet (10 mg total) daily for 3 days. Start taking on: June 06, 2019       No Known Allergies   Procedures/Studies: DG Chest Portable 1 View  Result Date: 06/04/2019 CLINICAL DATA:  COVID-19 positive with cough and fever EXAM: PORTABLE CHEST 1 VIEW COMPARISON:  January 30, 2018 FINDINGS: There is ill-defined airspace opacity throughout each lower lobe and right mid lung region. There is no frank consolidation. Heart is upper normal in size with pulmonary vascularity normal. No adenopathy. No bone lesions. IMPRESSION: Areas of ill-defined airspace opacity bilaterally, somewhat more on the right than on the left, likely representing multifocal atypical organism pneumonia. No consolidation. Stable cardiac silhouette. No adenopathy. Electronically Signed   By: Bretta Bang III M.D.   On: 06/04/2019 12:29    Subjective: No  acute issues or events overnight   Discharge Exam: Vitals:   06/06/19 0500 06/06/19 0705  BP:  122/88  Pulse: 77 77  Resp:  18  Temp:  98.1 F (36.7 C)  SpO2: 97% 97%   Vitals:   06/06/19 0300 06/06/19 0400 06/06/19 0500 06/06/19 0705  BP:  117/80  122/88  Pulse: 72 73 77 77  Resp:  16  18  Temp:  97.6 F (36.4 C)  98.1 F (36.7  C)  TempSrc:  Oral  Oral  SpO2: 98% 99% 97% 97%  Weight:      Height:        General:  Pleasantly resting in bed, No acute distress. HEENT:  Normocephalic atraumatic.  Sclerae nonicteric, noninjected.  Extraocular movements intact bilaterally. Neck:  Without mass or deformity.  Trachea is midline. Lungs:  Clear to auscultate bilaterally without rhonchi, wheeze, or rales. Heart:  Regular rate and rhythm.  Without murmurs, rubs, or gallops. Abdomen:  Soft, nontender, nondistended.  Without guarding or rebound. Extremities: Without cyanosis, clubbing, edema, or obvious deformity. Vascular:  Dorsalis pedis and posterior tibial pulses palpable bilaterally. Skin:  Warm and dry, no erythema, no ulcerations.   The results of significant diagnostics from this hospitalization (including imaging, microbiology, ancillary and laboratory) are listed below for reference.     Microbiology: Recent Results (from the past 240 hour(s))  Novel Coronavirus, NAA ([facility])     Status: Abnormal   Collection Time: 05/30/19  3:02 PM   Specimen: Nasopharyngeal(NP) swabs in vial transport medium   NASOPHARYNGE  SCREENIN  Result Value Ref Range Status   SARS-CoV-2, NAA Detected (A) Not Detected Final    Comment: This nucleic acid amplification test was developed and its performance characteristics determined by [employer]. Nucleic acid amplification tests include PCR and TMA. This test has not been FDA cleared or approved. This test has been authorized by FDA under an Emergency Use Authorization (EUA). This test is only authorized for the duration of time the declaration that circumstances exist justifying the authorization of the emergency use of in vitro diagnostic tests for detection of SARS-CoV-2 virus and/or diagnosis of COVID-19 infection under section 564(b)(1) of the Act, 21 U.S.C. 299MEQ-6(S) (1), unless the authorization is terminated or revoked sooner. When diagnostic testing is  negative, the possibility of a false negative result should be considered in the context of a patient's recent exposures and the presence of clinical signs and symptoms consistent with COVID-19. An individual without symptoms of COVID-19 and who is not shedding SARS-CoV-2 virus would  expect to have a negative (not detected) result in this assay.   Blood Culture (routine x 2)     Status: None (Preliminary result)   Collection Time: 06/04/19 12:37 PM   Specimen: BLOOD  Result Value Ref Range Status   Specimen Description   Final    BLOOD LEFT ANTECUBITAL Performed at [facility], [address] [place], [place] [address]    Special Requests   Final    BOTTLES DRAWN AEROBIC AND ANAEROBIC Blood Culture adequate volume Performed at [facility], [address] [place], [place] [address]    Culture   Final    NO GROWTH 2 DAYS Performed at [facility], [address] [place], [place] [address]    Report Status PENDING  Incomplete  Blood Culture (routine x 2)     Status: None (Preliminary result)   Collection Time: 06/04/19 12:41 PM   Specimen:  BLOOD  Result Value Ref Range Status   Specimen Description   Final    BLOOD RIGHT ANTECUBITAL Performed at [facility], [address] [place], [place] [address]    Special Requests   Final    BOTTLES DRAWN AEROBIC AND ANAEROBIC Blood Culture adequate volume Performed at [facility], [address] [place], [place] [address]    Culture   Final    NO GROWTH 2 DAYS Performed at [facility], [address], [place],  [address]    Report Status PENDING  Incomplete     Labs: BNP (last 3 results) No results for input(s): BNP in the last 8760 hours. Basic Metabolic Panel: Recent Labs  Lab 06/04/19 1235 06/05/19 0610  NA 133* 135  K 3.7 4.1  CL 97* 96*  CO2 25 25  GLUCOSE 113* 123*  BUN 16 16  CREATININE 1.06 0.81  CALCIUM 8.7* 8.8*   Liver Function  Tests: Recent Labs  Lab 06/04/19 1235 06/05/19 0610 06/06/19 0221  AST 39 63*  --   ALT 37 60* 76*  ALKPHOS 48 55  --   BILITOT 0.8 0.7  --   PROT 8.3* 8.6*  --   ALBUMIN 3.9 3.9  --    No results for input(s): LIPASE, AMYLASE in the last 168 hours. No results for input(s): AMMONIA in the last 168 hours. CBC: Recent Labs  Lab 06/04/19 1235 06/05/19 0610  WBC 5.4 5.1  NEUTROABS 3.0 2.8  HGB 12.7* 12.8*  HCT 39.5 40.5  MCV 87.0 87.9  PLT 241 282   Cardiac Enzymes: No results for input(s): CKTOTAL, CKMB, CKMBINDEX, TROPONINI in the last 168 hours. BNP: Invalid input(s): POCBNP CBG: No results for input(s): GLUCAP in the last 168 hours. D-Dimer Recent Labs    06/04/19 1235 06/05/19 0610  DDIMER 0.50 0.47   Hgb A1c No results for input(s): HGBA1C in the last 72 hours. Lipid Profile Recent Labs    06/04/19 1235  TRIG 102   Thyroid function studies No results for input(s): TSH, T4TOTAL, T3FREE, THYROIDAB in the last 72 hours.  Invalid input(s): FREET3 Anemia work up Recent Labs    06/04/19 1235 06/05/19 0610  FERRITIN 370* 392*   Urinalysis No results found for: COLORURINE, APPEARANCEUR, LABSPEC, [place], GLUCOSEU, [place], BILIRUBINUR, [place], PROTEINUR, UROBILINOGEN, NITRITE, LEUKOCYTESUR Sepsis Labs Invalid input(s): PROCALCITONIN,  WBC,  LACTICIDVEN Microbiology Recent Results (from the past 240 hour(s))  Novel Coronavirus, NAA ([facility])     Status: Abnormal   Collection Time: 05/30/19  3:02 PM   Specimen: Nasopharyngeal(NP) swabs in vial transport medium   NASOPHARYNGE  SCREENIN  Result Value Ref Range Status   SARS-CoV-2, NAA Detected (A) Not Detected Final    Comment: This nucleic acid amplification test was developed and its performance characteristics determined by [employer]. Nucleic acid amplification tests include PCR and TMA. This test has not been FDA cleared or approved. This test has been authorized by FDA under  an Emergency Use Authorization (EUA). This test is only authorized for the duration of time the declaration that circumstances exist justifying the authorization of the emergency use of in vitro diagnostic tests for detection of SARS-CoV-2 virus and/or diagnosis of COVID-19 infection under section 564(b)(1) of the Act, 21 U.S.C. 182XHB-7(J) (1), unless the authorization is terminated or revoked sooner. When diagnostic testing is negative, the possibility of a false negative result should be considered in the context of a patient's recent exposures and  the presence of clinical signs and symptoms consistent with COVID-19. An individual without symptoms of COVID-19 and who is not shedding SARS-CoV-2 virus would  expect to have a negative (not detected) result in this assay.   Blood Culture (routine x 2)     Status: None (Preliminary result)   Collection Time: 06/04/19 12:37 PM   Specimen: BLOOD  Result Value Ref Range Status   Specimen Description   Final    BLOOD LEFT ANTECUBITAL Performed at [facility], [address] [place], [place] [address]    Special Requests   Final    BOTTLES DRAWN AEROBIC AND ANAEROBIC Blood Culture adequate volume Performed at [facility], [address] [place], [place] [address]    Culture   Final    NO GROWTH 2 DAYS Performed at [facility], [address], [place], [place] [address]    Report Status PENDING  Incomplete  Blood Culture (routine x 2)     Status: None (Preliminary result)   Collection Time: 06/04/19 12:41 PM   Specimen: BLOOD  Result Value Ref Range Status   Specimen Description   Final    BLOOD RIGHT ANTECUBITAL Performed at [facility], [address] [place], [place] [address]    Special Requests   Final    BOTTLES DRAWN AEROBIC AND ANAEROBIC Blood Culture adequate volume Performed at [facility], [address] [place], [place] [address]    Culture   Final    NO  GROWTH 2 DAYS Performed at [facility], [address] [place], [place] [address]    Report Status PENDING  Incomplete     Time coordinating discharge: Over 30 minutes  SIGNED:   Azucena Fallen, DO [facility] 06/06/2019, 12:49 PM Pager   If 7PM-7AM, please contact night-coverage [email or web address] Password [email or web address]

## 2019-06-06 NOTE — Progress Notes (Signed)
Pt asking for financial assistance with hospital bill.  Gave pt number to financial assistance after speaking with on call case management. Number given to pt to call during business hours.

## 2019-06-06 NOTE — Progress Notes (Signed)
1730: Pt did not want [facility] to transport. He has another friend coming at 6pm.

## 2019-06-06 NOTE — Discharge Instructions (Addendum)
COVID-19 Frequently Asked Questions COVID-19 (coronavirus disease) is an infection that is caused by a large family of viruses. Some viruses cause illness in people and others cause illness in animals like camels, cats, and bats. In some cases, the viruses that cause illness in animals can spread to humans. Where did the coronavirus come from? In December 2019, [place] told the [facility]) of several cases of lung disease (human respiratory illness). These cases were linked to an open seafood and livestock market in the city of [place]. The link to the seafood and livestock market suggests that the virus may have spread from animals to humans. However, since that first outbreak in December, the virus has also been shown to spread from person to person. What is the name of the disease and the virus? Disease name Early on, this disease was called novel coronavirus. This is because scientists determined that the disease was caused by a new (novel) respiratory virus. The World Health Organization [facility]) has now named the disease COVID-19, or coronavirus disease. Virus name The virus that causes the disease is called severe acute respiratory syndrome coronavirus 2 (SARS-CoV-2). More information on disease and virus naming World Health Organization [facility]): www.who.int/emergencies/diseases/novel-coronavirus-2019/technical-guidance/naming-the-coronavirus-disease-(covid-2019)-and-the-virus-that-causes-it Who is at risk for complications from coronavirus disease? Some people may be at higher risk for complications from coronavirus disease. This includes older adults and people who have chronic diseases, such as heart disease, diabetes, and lung disease. If you are at higher risk for complications, take these extra precautions:  Stay home as much as possible.  Avoid social gatherings and travel.  Avoid close contact with others. Stay at least 6 ft (2 m) away from others, if  possible.  Wash your hands often with soap and water for at least 20 seconds.  Avoid touching your face, mouth, nose, or eyes.  Keep supplies on hand at home, such as food, medicine, and cleaning supplies.  If you must go out in public, wear a cloth face covering or face mask. Make sure your mask covers your nose and mouth. How does coronavirus disease spread? The virus that causes coronavirus disease spreads easily from person to person (is contagious). You may catch the virus by:  Breathing in droplets from an infected person. Droplets can be spread by a person breathing, speaking, singing, coughing, or sneezing.  Touching something, like a table or a doorknob, that was exposed to the virus (contaminated) and then touching your mouth, nose, or eyes. Can I get the virus from touching surfaces or objects? There is still a lot that we do not know about the virus that causes coronavirus disease. Scientists are basing a lot of information on what they know about similar viruses, such as:  Viruses cannot generally survive on surfaces for long. They need a human body (host) to survive.  It is more likely that the virus is spread by close contact with people who are sick (direct contact), such as through: ? Shaking hands or hugging. ? Breathing in respiratory droplets that travel through the air. Droplets can be spread by a person breathing, speaking, singing, coughing, or sneezing.  It is less likely that the virus is spread when a person touches a surface or object that has the virus on it (indirect contact). The virus may be able to enter the body if the person touches a surface or object and then touches his or her face, eyes, nose, or mouth. Can a person spread the virus without having symptoms of the  disease? It may be possible for the virus to spread before a person has symptoms of the disease, but this is most likely not the main way the virus is spreading. It is more likely for the virus  to spread by being in close contact with people who are sick and breathing in the respiratory droplets spread by a person breathing, speaking, singing, coughing, or sneezing. What are the symptoms of coronavirus disease? Symptoms vary from person to person and can range from mild to severe. Symptoms may include:  Fever or chills.  Cough.  Difficulty breathing or feeling short of breath.  Headaches, body aches, or muscle aches.  Runny or stuffy (congested) nose.  Sore throat.  New loss of taste or smell.  Nausea, vomiting, or diarrhea. These symptoms can appear anywhere from 2 to 14 days after you have been exposed to the virus. Some people may not have any symptoms. If you develop symptoms, call your health care provider. People with severe symptoms may need hospital care. Should I be tested for this virus? Your health care provider will decide whether to test you based on your symptoms, history of exposure, and your risk factors. How does a health care provider test for this virus? Health care providers will collect samples to send for testing. Samples may include:  Taking a swab of fluid from the back of your nose and throat, your nose, or your throat.  Taking fluid from the lungs by having you cough up mucus (sputum) into a sterile cup.  Taking a blood sample. Is there a treatment or vaccine for this virus? Currently, there is no vaccine to prevent coronavirus disease. Also, there are no medicines like antibiotics or antivirals to treat the virus. A person who becomes sick is given supportive care, which means rest and fluids. A person may also relieve his or her symptoms by using over-the-counter medicines that treat sneezing, coughing, and runny nose. These are the same medicines that a person takes for the common cold. If you develop symptoms, call your health care provider. People with severe symptoms may need hospital care. What can I do to protect myself and my family from  this virus?     You can protect yourself and your family by taking the same actions that you would take to prevent the spread of other viruses. Take the following actions:  Wash your hands often with soap and water for at least 20 seconds. If soap and water are not available, use alcohol-based hand sanitizer.  Avoid touching your face, mouth, nose, or eyes.  Cough or sneeze into a tissue, sleeve, or elbow. Do not cough or sneeze into your hand or the air. ? If you cough or sneeze into a tissue, throw it away immediately and wash your hands.  Disinfect objects and surfaces that you frequently touch every day.  Stay away from people who are sick.  Avoid going out in public, follow guidance from your state and local health authorities.  Avoid crowded indoor spaces. Stay at least 6 ft (2 m) away from others.  If you must go out in public, wear a cloth face covering or face mask. Make sure your mask covers your nose and mouth.  Stay home if you are sick, except to get medical care. Call your health care provider before you get medical care. Your health care provider will tell you how long to stay home.  Make sure your vaccines are up to date. Ask your health care provider  what vaccines you need. What should I do if I need to travel? Follow travel recommendations from your local health authority, the CDC, and WHO. Travel information and advice  Centers for Disease Control and Prevention (CDC): BodyEditor.hu  World Health Organization [facility]): ThirdIncome.ca Know the risks and take action to protect your health  You are at higher risk of getting coronavirus disease if you are traveling to areas with an outbreak or if you are exposed to travelers from areas with an outbreak.  Wash your hands often and practice good hygiene to lower the risk of catching or spreading the virus. What should I do  if I am sick? General instructions to stop the spread of infection  Wash your hands often with soap and water for at least 20 seconds. If soap and water are not available, use alcohol-based hand sanitizer.  Cough or sneeze into a tissue, sleeve, or elbow. Do not cough or sneeze into your hand or the air.  If you cough or sneeze into a tissue, throw it away immediately and wash your hands.  Stay home unless you must get medical care. Call your health care provider or local health authority before you get medical care.  Avoid public areas. Do not take public transportation, if possible.  If you can, wear a mask if you must go out of the house or if you are in close contact with someone who is not sick. Make sure your mask covers your nose and mouth. Keep your home clean  Disinfect objects and surfaces that are frequently touched every day. This may include: ? Counters and tables. ? Doorknobs and light switches. ? Sinks and faucets. ? Electronics such as phones, remote controls, keyboards, computers, and tablets.  Wash dishes in hot, soapy water or use a dishwasher. Air-dry your dishes.  Wash laundry in hot water. Prevent infecting other household members  Let healthy household members care for children and pets, if possible. If you have to care for children or pets, wash your hands often and wear a mask.  Sleep in a different bedroom or bed, if possible.  Do not share personal items, such as razors, toothbrushes, deodorant, combs, brushes, towels, and washcloths. Where to find more information Centers for Disease Control and Prevention (CDC)  Information and news updates: https://www.butler-gonzalez.com/ World Health Organization [facility])  Information and news updates: MissExecutive.com.ee  Coronavirus health topic: https://www.castaneda.info/  Questions and answers on COVID-19:  OpportunityDebt.at  Global tracker: who.sprinklr.com American Academy of Pediatrics (AAP)  Information for families: www.healthychildren.org/English/health-issues/conditions/chest-lungs/Pages/2019-Novel-Coronavirus.aspx The coronavirus situation is changing rapidly. Check your local health authority website or the CDC and [facility] websites for updates and news. When should I contact a health care provider?  Contact your health care provider if you have symptoms of an infection, such as fever or cough, and you: ? Have been near anyone who is known to have coronavirus disease. ? Have come into contact with a person who is suspected to have coronavirus disease. ? Have traveled to an area where there is an outbreak of COVID-19. When should I get emergency medical care?  Get help right away by calling your local emergency services (911 in the [place] if you have: ? Trouble breathing. ? Pain or pressure in your chest. ? Confusion. ? Blue-tinged lips and fingernails. ? Difficulty waking from sleep. ? Symptoms that get worse. Let the emergency medical personnel know if you think you have coronavirus disease. Summary  A new respiratory virus is spreading from person to person and  causing COVID-19 (coronavirus disease).  The virus that causes COVID-19 appears to spread easily. It spreads from one person to another through droplets from breathing, speaking, singing, coughing, or sneezing.  Older adults and those with chronic diseases are at higher risk of disease. If you are at higher risk for complications, take extra precautions.  There is currently no vaccine to prevent coronavirus disease. There are no medicines, such as antibiotics or antivirals, to treat the virus.  You can protect yourself and your family by washing your hands often, avoiding touching your face, and covering your coughs and sneezes. This information is not intended to replace advice given to you  by your health care provider. Make sure you discuss any questions you have with your health care provider. Document Revised: 03/09/2019 Document Reviewed: 09/05/2018 Elsevier Patient Education  [place].   Acute Respiratory Failure, Adult  Acute respiratory failure occurs when there is not enough oxygen passing from your lungs to your body. When this happens, your lungs have trouble removing carbon dioxide from the blood. This causes your blood oxygen level to drop too low as carbon dioxide builds up. Acute respiratory failure is a medical emergency. It can develop quickly, but it is temporary if treated promptly. Your lung capacity, or how much air your lungs can hold, may improve with time, exercise, and treatment. What are the causes? There are many possible causes of acute respiratory failure, including:  Lung injury.  Chest injury or damage to the ribs or tissues near the lungs.  Lung conditions that affect the flow of air and blood into and out of the lungs, such as pneumonia, acute respiratory distress syndrome, and cystic fibrosis.  Medical conditions, such as strokes or spinal cord injuries, that affect the muscles and nerves that control breathing.  Blood infection (sepsis).  Inflammation of the pancreas (pancreatitis).  A blood clot in the lungs (pulmonary embolism).  A large-volume blood transfusion.  Burns.  Near-drowning.  Seizure.  Smoke inhalation.  Reaction to medicines.  Alcohol or drug overdose. What increases the risk? This condition is more likely to develop in people who have:  A blocked airway.  Asthma.  A condition or disease that damages or weakens the muscles, nerves, bones, or tissues that are involved in breathing.  A serious infection.  A health problem that blocks the unconscious reflex that is involved in breathing, such as hypothyroidism or sleep apnea.  A lung injury or trauma. What are the signs or symptoms? Trouble  breathing is the main symptom of acute respiratory failure. Symptoms may also include:  Rapid breathing.  Restlessness or anxiety.  Skin, lips, or fingernails that appear blue (cyanosis).  Rapid heart rate.  Abnormal heart rhythms (arrhythmias).  Confusion or changes in behavior.  Tiredness or loss of energy.  Feeling sleepy or having a loss of consciousness. How is this diagnosed? Your health care provider can diagnose acute respiratory failure with a medical history and physical exam. During the exam, your health care provider will listen to your heart and check for crackling or wheezing sounds in your lungs. Your may also have tests to confirm the diagnosis and determine what is causing respiratory failure. These tests may include:  Measuring the amount of oxygen in your blood (pulse oximetry). The measurement comes from a small device that is placed on your finger, earlobe, or toe.  Other blood tests to measure blood gases and to look for signs of infection.  Sampling your cerebral spinal fluid or tracheal  fluid to check for infections.  Chest X-ray to look for fluid in spaces that should be filled with air.  Electrocardiogram (ECG) to look at the heart's electrical activity. How is this treated? Treatment for this condition usually takes places in a hospital intensive care unit (ICU). Treatment depends on what is causing the condition. It may include one or more treatments until your symptoms improve. Treatment may include:  Supplemental oxygen. Extra oxygen is given through a tube in the nose, a face mask, or a hood.  A device such as a continuous positive airway pressure (CPAP) or bi-level positive airway pressure (BiPAP or BPAP) machine. This treatment uses mild air pressure to keep the airways open. A mask or other device will be placed over your nose or mouth. A tube that is connected to a motor will deliver oxygen through the mask.  Ventilator. This treatment helps move  air into and out of the lungs. This may be done with a bag and mask or a machine. For this treatment, a tube is placed in your windpipe (trachea) so air and oxygen can flow to the lungs.  Extracorporeal membrane oxygenation (ECMO). This treatment temporarily takes over the function of the heart and lungs, supplying oxygen and removing carbon dioxide. ECMO gives the lungs a chance to recover. It may be used if a ventilator is not effective.  Tracheostomy. This is a procedure that creates a hole in the neck to insert a breathing tube.  Receiving fluids and medicines.  Rocking the bed to help breathing. Follow these instructions at home:  Take over-the-counter and prescription medicines only as told by your health care provider.  Return to normal activities as told by your health care provider. Ask your health care provider what activities are safe for you.  Keep all follow-up visits as told by your health care provider. This is important. How is this prevented? Treating infections and medical conditions that may lead to acute respiratory failure can help prevent the condition from developing. Contact a health care provider if:  You have a fever.  Your symptoms do not improve or they get worse. Get help right away if:  You are having trouble breathing.  You lose consciousness.  Your have cyanosis or turn blue.  You develop a rapid heart rate.  You are confused. These symptoms may represent a serious problem that is an emergency. Do not wait to see if the symptoms will go away. Get medical help right away. Call your local emergency services (911 in the [place] Do not drive yourself to the hospital. This information is not intended to replace advice given to you by your health care provider. Make sure you discuss any questions you have with your health care provider. Document Revised: 04/22/2017 Document Reviewed: 11/26/2015 Elsevier Patient Education  [place] Can Do to Manage Your COVID-19 Symptoms at Home If you have possible or confirmed COVID-19: 1. Stay home from work and school. And stay away from other public places. If you must go out, avoid using any kind of public transportation, ridesharing, or taxis. 2. Monitor your symptoms carefully. If your symptoms get worse, call your healthcare provider immediately. 3. Get rest and stay hydrated. 4. If you have a medical appointment, call the healthcare provider ahead of time and tell them that you have or may have COVID-19. 5. For medical emergencies, call 911 and notify the dispatch personnel that you have or may have COVID-19. 6. Cover your cough  and sneezes with a tissue or use the inside of your elbow. 7. Wash your hands often with soap and water for at least 20 seconds or clean your hands with an alcohol-based hand sanitizer that contains at least 60% alcohol. 8. As much as possible, stay in a specific room and away from other people in your home. Also, you should use a separate bathroom, if available. If you need to be around other people in or outside of the home, wear a mask. 9. Avoid sharing personal items with other people in your household, like dishes, towels, and bedding. 10. Clean all surfaces that are touched often, like counters, tabletops, and doorknobs. Use household cleaning sprays or wipes according to the label instructions. SouthAmericaFlowers.co.uk 11/22/2018 This information is not intended to replace advice given to you by your health care provider. Make sure you discuss any questions you have with your health care provider. Document Revised: 04/26/2019 Document Reviewed: 04/26/2019 Elsevier Patient Education  2020 Elsevier Inc.   COVID-19 COVID-19 is a respiratory infection that is caused by a virus called severe acute respiratory syndrome coronavirus 2 (SARS-CoV-2). The disease is also known as coronavirus disease or novel coronavirus. In some people, the virus may not cause  any symptoms. In others, it may cause a serious infection. The infection can get worse quickly and can lead to complications, such as:  Pneumonia, or infection of the lungs.  Acute respiratory distress syndrome or ARDS. This is a condition in which fluid build-up in the lungs prevents the lungs from filling with air and passing oxygen into the blood.  Acute respiratory failure. This is a condition in which there is not enough oxygen passing from the lungs to the body or when carbon dioxide is not passing from the lungs out of the body.  Sepsis or septic shock. This is a serious bodily reaction to an infection.  Blood clotting problems.  Secondary infections due to bacteria or fungus.  Organ failure. This is when your body's organs stop working. The virus that causes COVID-19 is contagious. This means that it can spread from person to person through droplets from coughs and sneezes (respiratory secretions). What are the causes? This illness is caused by a virus. You may catch the virus by:  Breathing in droplets from an infected person. Droplets can be spread by a person breathing, speaking, singing, coughing, or sneezing.  Touching something, like a table or a doorknob, that was exposed to the virus (contaminated) and then touching your mouth, nose, or eyes. What increases the risk? Risk for infection You are more likely to be infected with this virus if you:  Are within 6 feet (2 meters) of a person with COVID-19.  Provide care for or live with a person who is infected with COVID-19.  Spend time in crowded indoor spaces or live in shared housing. Risk for serious illness You are more likely to become seriously ill from the virus if you:  Are 92 years of age or older. The higher your age, the more you are at risk for serious illness.  Live in a nursing home or long-term care facility.  Have cancer.  Have a long-term (chronic) disease such as: ? Chronic lung disease, including  chronic obstructive pulmonary disease or asthma. ? A long-term disease that lowers your body's ability to fight infection (immunocompromised). ? Heart disease, including heart failure, a condition in which the arteries that lead to the heart become narrow or blocked (coronary artery disease), a disease which  makes the heart muscle thick, weak, or stiff (cardiomyopathy). ? Diabetes. ? Chronic kidney disease. ? Sickle cell disease, a condition in which red blood cells have an abnormal "sickle" shape. ? Liver disease.  Are obese. What are the signs or symptoms? Symptoms of this condition can range from mild to severe. Symptoms may appear any time from 2 to 14 days after being exposed to the virus. They include:  A fever or chills.  A cough.  Difficulty breathing.  Headaches, body aches, or muscle aches.  Runny or stuffy (congested) nose.  A sore throat.  New loss of taste or smell. Some people may also have stomach problems, such as nausea, vomiting, or diarrhea. Other people may not have any symptoms of COVID-19. How is this diagnosed? This condition may be diagnosed based on:  Your signs and symptoms, especially if: ? You live in an area with a COVID-19 outbreak. ? You recently traveled to or from an area where the virus is common. ? You provide care for or live with a person who was diagnosed with COVID-19. ? You were exposed to a person who was diagnosed with COVID-19.  A physical exam.  Lab tests, which may include: ? Taking a sample of fluid from the back of your nose and throat (nasopharyngeal fluid), your nose, or your throat using a swab. ? A sample of mucus from your lungs (sputum). ? Blood tests.  Imaging tests, which may include, X-rays, CT scan, or ultrasound. How is this treated? At present, there is no medicine to treat COVID-19. Medicines that treat other diseases are being used on a trial basis to see if they are effective against COVID-19. Your health care  provider will talk with you about ways to treat your symptoms. For most people, the infection is mild and can be managed at home with rest, fluids, and over-the-counter medicines. Treatment for a serious infection usually takes places in a hospital intensive care unit (ICU). It may include one or more of the following treatments. These treatments are given until your symptoms improve.  Receiving fluids and medicines through an IV.  Supplemental oxygen. Extra oxygen is given through a tube in the nose, a face mask, or a hood.  Positioning you to lie on your stomach (prone position). This makes it easier for oxygen to get into the lungs.  Continuous positive airway pressure (CPAP) or bi-level positive airway pressure (BPAP) machine. This treatment uses mild air pressure to keep the airways open. A tube that is connected to a motor delivers oxygen to the body.  Ventilator. This treatment moves air into and out of the lungs by using a tube that is placed in your windpipe.  Tracheostomy. This is a procedure to create a hole in the neck so that a breathing tube can be inserted.  Extracorporeal membrane oxygenation (ECMO). This procedure gives the lungs a chance to recover by taking over the functions of the heart and lungs. It supplies oxygen to the body and removes carbon dioxide. Follow these instructions at home: Lifestyle  If you are sick, stay home except to get medical care. Your health care provider will tell you how long to stay home. Call your health care provider before you go for medical care.  Rest at home as told by your health care provider.  Do not use any products that contain nicotine or tobacco, such as cigarettes, e-cigarettes, and chewing tobacco. If you need help quitting, ask your health care provider.  Return to your  normal activities as told by your health care provider. Ask your health care provider what activities are safe for you. General instructions  Take  over-the-counter and prescription medicines only as told by your health care provider.  Drink enough fluid to keep your urine pale yellow.  Keep all follow-up visits as told by your health care provider. This is important. How is this prevented?  There is no vaccine to help prevent COVID-19 infection. However, there are steps you can take to protect yourself and others from this virus. To protect yourself:   Do not travel to areas where COVID-19 is a risk. The areas where COVID-19 is reported change often. To identify high-risk areas and travel restrictions, check the CDC travel website: StageSync.siwwwnc.cdc.gov/travel/notices  If you live in, or must travel to, an area where COVID-19 is a risk, take precautions to avoid infection. ? Stay away from people who are sick. ? Wash your hands often with soap and water for 20 seconds. If soap and water are not available, use an alcohol-based hand sanitizer. ? Avoid touching your mouth, face, eyes, or nose. ? Avoid going out in public, follow guidance from your state and local health authorities. ? If you must go out in public, wear a cloth face covering or face mask. Make sure your mask covers your nose and mouth. ? Avoid crowded indoor spaces. Stay at least 6 feet (2 meters) away from others. ? Disinfect objects and surfaces that are frequently touched every day. This may include:  Counters and tables.  Doorknobs and light switches.  Sinks and faucets.  Electronics, such as phones, remote controls, keyboards, computers, and tablets. To protect others: If you have symptoms of COVID-19, take steps to prevent the virus from spreading to others.  If you think you have a COVID-19 infection, contact your health care provider right away. Tell your health care team that you think you may have a COVID-19 infection.  Stay home. Leave your house only to seek medical care. Do not use public transport.  Do not travel while you are sick.  Wash your hands often  with soap and water for 20 seconds. If soap and water are not available, use alcohol-based hand sanitizer.  Stay away from other members of your household. Let healthy household members care for children and pets, if possible. If you have to care for children or pets, wash your hands often and wear a mask. If possible, stay in your own room, separate from others. Use a different bathroom.  Make sure that all people in your household wash their hands well and often.  Cough or sneeze into a tissue or your sleeve or elbow. Do not cough or sneeze into your hand or into the air.  Wear a cloth face covering or face mask. Make sure your mask covers your nose and mouth. Where to find more information  Centers for Disease Control and Prevention: StickerEmporium.tnwww.cdc.gov/coronavirus/2019-ncov/index.html  World Health Organization: https://thompson-craig.com/www.who.int/health-topics/coronavirus Contact a health care provider if:  You live in or have traveled to an area where COVID-19 is a risk and you have symptoms of the infection.  You have had contact with someone who has COVID-19 and you have symptoms of the infection. Get help right away if:  You have trouble breathing.  You have pain or pressure in your chest.  You have confusion.  You have bluish lips and fingernails.  You have difficulty waking from sleep.  You have symptoms that get worse. These symptoms may represent a serious  problem that is an emergency. Do not wait to see if the symptoms will go away. Get medical help right away. Call your local emergency services (911 in the [place] Do not drive yourself to the hospital. Let the emergency medical personnel know if you think you have COVID-19. Summary  COVID-19 is a respiratory infection that is caused by a virus. It is also known as coronavirus disease or novel coronavirus. It can cause serious infections, such as pneumonia, acute respiratory distress syndrome, acute respiratory failure, or sepsis.  The virus that  causes COVID-19 is contagious. This means that it can spread from person to person through droplets from breathing, speaking, singing, coughing, or sneezing.  You are more likely to develop a serious illness if you are 14 years of age or older, have a weak immune system, live in a nursing home, or have chronic disease.  There is no medicine to treat COVID-19. Your health care provider will talk with you about ways to treat your symptoms.  Take steps to protect yourself and others from infection. Wash your hands often and disinfect objects and surfaces that are frequently touched every day. Stay away from people who are sick and wear a mask if you are sick. This information is not intended to replace advice given to you by your health care provider. Make sure you discuss any questions you have with your health care provider. Document Revised: 03/09/2019 Document Reviewed: 06/15/2018 Elsevier Patient Education  2020 [employer]. You are scheduled for an outpatient infusion of Remdesivir at 1PM on Thursday 1/14, and Friday 1/15.  Please report to [facility] at [address]  Drive to the security guard and tell them you are here for an infusion. They will direct you to the front entrance where we will come and get you.  For questions call [phone].  Thanks

## 2019-06-06 NOTE — TOC Initial Note (Signed)
Transition of Care [facility]) - Initial/Assessment Note    Patient Details  Name: David Wise MRN: [id number] Date of Birth: [birth year]  [facility] CM/SW Contact:    Golda Acre, RN Phone Number: 06/06/2019, 2:36 PM  Clinical Narrative:                 Discharged to home via family.  No orders at discharge for o2 or equipment.  Expected Discharge Plan: Home/Self Care Barriers to Discharge: No Barriers Identified   Patient Goals and CMS Choice        Expected Discharge Plan and Services Expected Discharge Plan: Home/Self Care   Discharge Planning Services: CM Consult   Living arrangements for the past 2 months: Single Family Home Expected Discharge Date: 06/06/19                                    Prior Living Arrangements/Services Living arrangements for the past 2 months: Single Family Home Lives with:: Spouse Patient language and need for interpreter reviewed:: No Do you feel safe going back to the place where you live?: Yes      Need for Family Participation in Patient Care: Yes (Comment)     Criminal Activity/Legal Involvement Pertinent to Current Situation/Hospitalization: No - Comment as needed  Activities of Daily Living Home Assistive Devices/Equipment: None ADL Screening (condition at time of admission) Patient's cognitive ability adequate to safely complete daily activities?: Yes Is the patient deaf or have difficulty hearing?: No Does the patient have difficulty seeing, even when wearing glasses/contacts?: No Does the patient have difficulty concentrating, remembering, or making decisions?: No Patient able to express need for assistance with ADLs?: Yes Does the patient have difficulty dressing or bathing?: No Independently performs ADLs?: Yes (appropriate for developmental age) Does the patient have difficulty walking or climbing stairs?: No Weakness of Legs: None Weakness of Arms/Hands: None  Permission Sought/Granted                   Emotional Assessment Appearance:: Appears stated age     Orientation: : Oriented to Self, Oriented to Place, Oriented to  Time, Oriented to Situation Alcohol / Substance Use: Not Applicable Psych Involvement: No (comment)  Admission diagnosis:  Acute respiratory failure with hypoxia (HCC) [J96.01] Pneumonia due to COVID-19 virus [U07.1, J12.82] COVID-19 [U07.1] Acute hypoxemic respiratory failure due to severe acute respiratory syndrome coronavirus 2 (SARS-CoV-2) disease (HCC) [U07.1, J96.01] Patient Active Problem List   Diagnosis Date Noted  . Pneumonia due to COVID-19 virus 06/04/2019  . Acute hypoxemic respiratory failure due to severe acute respiratory syndrome coronavirus 2 (SARS-CoV-2) disease (HCC) 06/04/2019  . Obesity hypoventilation syndrome (HCC) 06/04/2019  . Essential hypertension 06/04/2019   PCP:  Patient, No Pcp Per Pharmacy:   [facility] [address] [place], [place] - [address] [place] [address] Phone: [phone] Fax: [phone]     Social Determinants of Health (SDOH) Interventions    Readmission Risk Interventions No flowsheet data found.

## 2019-06-06 NOTE — Plan of Care (Signed)
No acute events during this shift. Pt has O2 dropped to 85-89 while asleep. tonight. Pt stated that he is going to get a sleep study done. He was told he has sleep apnea according to pt but not formally diagnosed. Pt VSS and WDL. Call bell within reach of pt, encouraged to use call bell for assistance although pt is independently ambulatory. Continue to monitor.   Problem: Education: Goal: Knowledge of risk factors and measures for prevention of condition will improve Outcome: Progressing   Problem: Coping: Goal: Psychosocial and spiritual needs will be supported Outcome: Progressing   Problem: Respiratory: Goal: Will maintain a patent airway Outcome: Progressing Goal: Complications related to the disease process, condition or treatment will be avoided or minimized Outcome: Progressing

## 2019-06-06 NOTE — Plan of Care (Signed)
  Problem: Education: Goal: Knowledge of risk factors and measures for prevention of condition will improve 06/06/2019 1312 by Zara Chess, RN Outcome: Adequate for Discharge 06/06/2019 1312 by Zara Chess, RN Outcome: Adequate for Discharge   Problem: Coping: Goal: Psychosocial and spiritual needs will be supported 06/06/2019 1312 by Zara Chess, RN Outcome: Adequate for Discharge 06/06/2019 1312 by Zara Chess, RN Outcome: Adequate for Discharge   Problem: Respiratory: Goal: Will maintain a patent airway 06/06/2019 1312 by Zara Chess, RN Outcome: Adequate for Discharge 06/06/2019 1312 by Zara Chess, RN Outcome: Adequate for Discharge Goal: Complications related to the disease process, condition or treatment will be avoided or minimized 06/06/2019 1312 by Zara Chess, RN Outcome: Adequate for Discharge 06/06/2019 1312 by Zara Chess, RN Outcome: Adequate for Discharge
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
## 2019-06-07 ENCOUNTER — Ambulatory Visit (HOSPITAL_COMMUNITY)
Admission: RE | Admit: 2019-06-07 | Discharge: 2019-06-07 | Disposition: A | Discharge status: Home / Self Care | Payer: HRSA Program | Source: Ambulatory Visit | Attending: Pulmonary Disease | Admitting: Pulmonary Disease

## 2019-06-07 DIAGNOSIS — J1289 Other viral pneumonia: Secondary | ICD-10-CM | POA: Diagnosis not present

## 2019-06-07 DIAGNOSIS — U071 COVID-19: Secondary | ICD-10-CM | POA: Diagnosis not present

## 2019-06-07 MED ORDER — SODIUM CHLORIDE 0.9 % IV SOLN: INTRAVENOUS | Status: DC | PRN

## 2019-06-07 MED ORDER — SODIUM CHLORIDE 0.9 % IV SOLN
INTRAVENOUS | Status: AC
  Administered 2019-06-07: 100 mg via INTRAVENOUS
  Filled 2019-06-07: qty 20

## 2019-06-07 MED ORDER — SODIUM CHLORIDE 0.9 % IV SOLN: 100.0000 mg | Freq: Once | INTRAVENOUS | Status: AC

## 2019-06-07 MED ORDER — FAMOTIDINE IN NACL 20-0.9 MG/50ML-% IV SOLN: 20.0000 mg | Freq: Once | INTRAVENOUS | Status: DC | PRN

## 2019-06-07 MED ORDER — DIPHENHYDRAMINE HCL 50 MG/ML IJ SOLN: 50.0000 mg | Freq: Once | INTRAMUSCULAR | Status: DC | PRN

## 2019-06-07 MED ORDER — EPINEPHRINE 0.3 MG/0.3ML IJ SOAJ: 0.3000 mg | Freq: Once | INTRAMUSCULAR | Status: DC | PRN

## 2019-06-07 MED ORDER — METHYLPREDNISOLONE SODIUM SUCC 125 MG IJ SOLR: 125.0000 mg | Freq: Once | INTRAMUSCULAR | Status: DC | PRN

## 2019-06-07 MED ORDER — ALBUTEROL SULFATE HFA 108 (90 BASE) MCG/ACT IN AERS: 2.0000 | INHALATION_SPRAY | Freq: Once | RESPIRATORY_TRACT | Status: DC | PRN

## 2019-06-07 NOTE — Progress Notes (Signed)
  Diagnosis: COVID-19  Physician:Dr. Shan Levans  Procedure: [facility] Infusion Clinic Med: remdesivir infusion.  Complications: No immediate complications noted.  Discharge: Discharged home   David Wise 06/07/2019

## 2019-06-08 ENCOUNTER — Ambulatory Visit (HOSPITAL_COMMUNITY)
Admit: 2019-06-08 | Discharge: 2019-06-08 | Disposition: A | Discharge status: Home / Self Care | Payer: HRSA Program | Attending: Pulmonary Disease | Admitting: Pulmonary Disease

## 2019-06-08 ENCOUNTER — Encounter (HOSPITAL_COMMUNITY): Payer: Self-pay

## 2019-06-08 DIAGNOSIS — U071 COVID-19: Secondary | ICD-10-CM | POA: Diagnosis not present

## 2019-06-08 MED ORDER — SODIUM CHLORIDE 0.9 % IV SOLN
INTRAVENOUS | Status: AC
  Filled 2019-06-08: qty 20

## 2019-06-08 MED ORDER — METHYLPREDNISOLONE SODIUM SUCC 125 MG IJ SOLR: 125.0000 mg | Freq: Once | INTRAMUSCULAR | Status: DC | PRN

## 2019-06-08 MED ORDER — FAMOTIDINE IN NACL 20-0.9 MG/50ML-% IV SOLN: 20.0000 mg | Freq: Once | INTRAVENOUS | Status: DC | PRN

## 2019-06-08 MED ORDER — SODIUM CHLORIDE 0.9 % IV SOLN
100.0000 mg | Freq: Once | INTRAVENOUS | Status: AC
  Administered 2019-06-08: 100 mg via INTRAVENOUS

## 2019-06-08 MED ORDER — EPINEPHRINE 0.3 MG/0.3ML IJ SOAJ: 0.3000 mg | Freq: Once | INTRAMUSCULAR | Status: DC | PRN

## 2019-06-08 MED ORDER — DIPHENHYDRAMINE HCL 50 MG/ML IJ SOLN: 50.0000 mg | Freq: Once | INTRAMUSCULAR | Status: DC | PRN

## 2019-06-08 MED ORDER — SODIUM CHLORIDE 0.9 % IV SOLN: INTRAVENOUS | Status: DC | PRN

## 2019-06-08 MED ORDER — ALBUTEROL SULFATE HFA 108 (90 BASE) MCG/ACT IN AERS: 2.0000 | INHALATION_SPRAY | Freq: Once | RESPIRATORY_TRACT | Status: DC | PRN

## 2019-06-08 NOTE — Progress Notes (Signed)
  Diagnosis: COVID-19  Physician: Dr. Wright  Procedure: Covid Infusion Clinic Med: remdesivir infusion.  Complications: No immediate complications noted.  Discharge: Discharged home   David Wise 06/08/2019  

## 2019-06-09 LAB — CULTURE, BLOOD (ROUTINE X 2)
Culture: NO GROWTH
Culture: NO GROWTH
Special Requests: ADEQUATE
Special Requests: ADEQUATE

## 2019-07-03 ENCOUNTER — Other Ambulatory Visit: Payer: Self-pay

## 2019-08-02 ENCOUNTER — Ambulatory Visit: Payer: Self-pay | Attending: Internal Medicine

## 2019-08-02 DIAGNOSIS — Z23 Encounter for immunization: Secondary | ICD-10-CM

## 2019-08-02 NOTE — Progress Notes (Signed)
   Covid-19 Vaccination Clinic  Name:  KEES IDROVO    MRN: [id number] DOB: February 10, 1971  08/02/2019  Mr. Guajardo was observed post Covid-19 immunization for 15 minutes without incident. He was provided with Vaccine Information Sheet and instruction to access the V-Safe system.   Mr. Overfelt was instructed to call 911 with any severe reactions post vaccine: Marland Kitchen Difficulty breathing  . Swelling of face and throat  . A fast heartbeat  . A bad rash all over body  . Dizziness and weakness   Immunizations Administered    Name Date Dose VIS Date Route   Pfizer COVID-19 Vaccine 08/02/2019  8:24 AM 0.3 mL 05/04/2019 Intramuscular   Manufacturer: [employer], [employer]   Lot: [id number]   NDC: [address]-2

## 2019-08-27 ENCOUNTER — Ambulatory Visit: Payer: Self-pay | Attending: Internal Medicine

## 2019-08-27 DIAGNOSIS — Z23 Encounter for immunization: Secondary | ICD-10-CM

## 2019-08-27 NOTE — Progress Notes (Signed)
   Covid-19 Vaccination Clinic  Name:  David Wise    MRN: [id number] DOB: December 10, 1970  08/27/2019  [name] was observed post Covid-19 immunization for 15 minutes without incident. He was provided with Vaccine Information Sheet and instruction to access the V-Safe system.   [name] was instructed to call 911 with any severe reactions post vaccine: Marland Kitchen Difficulty breathing  . Swelling of face and throat  . A fast heartbeat  . A bad rash all over body  . Dizziness and weakness   Immunizations Administered    Name Date Dose VIS Date Route   Pfizer COVID-19 Vaccine 08/27/2019  8:19 AM 0.3 mL 05/04/2019 Intramuscular   Manufacturer: [employer], [employer]   Lot: [id number]   NDC: [address]-6

## 2020-04-23 DIAGNOSIS — U071 COVID-19: Secondary | ICD-10-CM

## 2020-04-23 HISTORY — DX: COVID-19: U07.1

## 2020-08-07 ENCOUNTER — Emergency Department (HOSPITAL_BASED_OUTPATIENT_CLINIC_OR_DEPARTMENT_OTHER)
Admission: EM | Admit: 2020-08-07 | Discharge: 2020-08-08 | Disposition: A | Discharge status: Home / Self Care | Payer: 59 | Attending: Emergency Medicine | Admitting: Emergency Medicine

## 2020-08-07 ENCOUNTER — Other Ambulatory Visit: Payer: Self-pay

## 2020-08-07 ENCOUNTER — Encounter (HOSPITAL_BASED_OUTPATIENT_CLINIC_OR_DEPARTMENT_OTHER): Payer: Self-pay | Admitting: *Deleted

## 2020-08-07 DIAGNOSIS — I10 Essential (primary) hypertension: Secondary | ICD-10-CM | POA: Insufficient documentation

## 2020-08-07 DIAGNOSIS — Z79899 Other long term (current) drug therapy: Secondary | ICD-10-CM | POA: Insufficient documentation

## 2020-08-07 DIAGNOSIS — Z8616 Personal history of COVID-19: Secondary | ICD-10-CM | POA: Diagnosis not present

## 2020-08-07 DIAGNOSIS — R200 Anesthesia of skin: Secondary | ICD-10-CM

## 2020-08-07 DIAGNOSIS — M25472 Effusion, left ankle: Secondary | ICD-10-CM | POA: Insufficient documentation

## 2020-08-07 NOTE — ED Triage Notes (Signed)
Numbness started in his feet and now the numbness is in his legs. symptoms for the past 3 weeks.
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
## 2020-08-07 NOTE — ED Notes (Signed)
ED Provider at bedside. 

## 2020-08-08 ENCOUNTER — Ambulatory Visit (HOSPITAL_BASED_OUTPATIENT_CLINIC_OR_DEPARTMENT_OTHER)
Admission: RE | Admit: 2020-08-08 | Discharge: 2020-08-08 | Disposition: A | Discharge status: Home / Self Care | Payer: 59 | Source: Ambulatory Visit | Attending: Emergency Medicine | Admitting: Emergency Medicine

## 2020-08-08 DIAGNOSIS — R200 Anesthesia of skin: Secondary | ICD-10-CM | POA: Insufficient documentation

## 2020-08-08 DIAGNOSIS — R600 Localized edema: Secondary | ICD-10-CM | POA: Insufficient documentation

## 2020-08-08 LAB — CBC WITH DIFFERENTIAL/PLATELET
Abs Immature Granulocytes: 0.01 10*3/uL (ref 0.00–0.07)
Basophils Absolute: 0 10*3/uL (ref 0.0–0.1)
Basophils Relative: 1 %
Eosinophils Absolute: 0.1 10*3/uL (ref 0.0–0.5)
Eosinophils Relative: 2 %
HCT: 39.9 % (ref 39.0–52.0)
Hemoglobin: 12.6 g/dL — ABNORMAL LOW (ref 13.0–17.0)
Immature Granulocytes: 0 %
Lymphocytes Relative: 64 %
Lymphs Abs: 3.7 10*3/uL (ref 0.7–4.0)
MCH: 27.1 pg (ref 26.0–34.0)
MCHC: 31.6 g/dL (ref 30.0–36.0)
MCV: 85.8 fL (ref 80.0–100.0)
Monocytes Absolute: 0.6 10*3/uL (ref 0.1–1.0)
Monocytes Relative: 11 %
Neutro Abs: 1.3 10*3/uL — ABNORMAL LOW (ref 1.7–7.7)
Neutrophils Relative %: 22 %
Platelets: 241 10*3/uL (ref 150–400)
RBC: 4.65 MIL/uL (ref 4.22–5.81)
RDW: 14.4 % (ref 11.5–15.5)
WBC: 5.8 10*3/uL (ref 4.0–10.5)
nRBC: 0 % (ref 0.0–0.2)

## 2020-08-08 LAB — BASIC METABOLIC PANEL
Anion gap: 11 (ref 5–15)
BUN: 14 mg/dL (ref 6–20)
CO2: 24 mmol/L (ref 22–32)
Calcium: 9 mg/dL (ref 8.9–10.3)
Chloride: 102 mmol/L (ref 98–111)
Creatinine, Ser: 0.74 mg/dL (ref 0.61–1.24)
GFR, Estimated: >60 mL/min (ref >60)
Glucose, Bld: 117 mg/dL — ABNORMAL HIGH (ref 70–99)
Potassium: 3.5 mmol/L (ref 3.5–5.1)
Sodium: 137 mmol/L (ref 135–145)

## 2020-08-08 NOTE — Discharge Instructions (Signed)
Return to the emergency department at the given time for an ultrasound of your legs to rule out a blood clot.  Follow-up with neurology in the next week if symptoms are not improving.  A referral has been placed and the contact information for [facility] neurology has been provided in this discharge summary for you to call and make these arrangements.

## 2020-08-08 NOTE — ED Provider Notes (Signed)
Pt came back to get a bilateral LE [place].  I let him know the results were negative for DVT.  IMPRESSION: No evidence of deep venous thrombosis in either lower extremity.  Pt to f/u with pcp and with neurology.   Jacalyn Lefevre, MD 08/08/20 1257

## 2020-09-28 ENCOUNTER — Other Ambulatory Visit: Payer: Self-pay

## 2020-09-28 ENCOUNTER — Emergency Department (HOSPITAL_BASED_OUTPATIENT_CLINIC_OR_DEPARTMENT_OTHER)
Admission: EM | Admit: 2020-09-28 | Discharge: 2020-09-29 | Disposition: A | Discharge status: Home / Self Care | Payer: 59 | Attending: Emergency Medicine | Admitting: Emergency Medicine

## 2020-09-28 ENCOUNTER — Encounter (HOSPITAL_BASED_OUTPATIENT_CLINIC_OR_DEPARTMENT_OTHER): Payer: Self-pay | Admitting: Emergency Medicine

## 2020-09-28 DIAGNOSIS — Z8616 Personal history of COVID-19: Secondary | ICD-10-CM | POA: Insufficient documentation

## 2020-09-28 DIAGNOSIS — E876 Hypokalemia: Secondary | ICD-10-CM | POA: Insufficient documentation

## 2020-09-28 DIAGNOSIS — R202 Paresthesia of skin: Secondary | ICD-10-CM

## 2020-09-28 DIAGNOSIS — I10 Essential (primary) hypertension: Secondary | ICD-10-CM | POA: Insufficient documentation

## 2020-09-28 DIAGNOSIS — R200 Anesthesia of skin: Secondary | ICD-10-CM

## 2020-09-28 NOTE — ED Triage Notes (Signed)
Reports bil numbness that started in feet and worked its way up to his thighs.  Going on for months.  Reports he has a follow up with specialist June 4th.  Unable to wait that long.  Reports he can't walk.  Did ambulate to the room.

## 2020-09-29 ENCOUNTER — Emergency Department (HOSPITAL_BASED_OUTPATIENT_CLINIC_OR_DEPARTMENT_OTHER): Payer: 59

## 2020-09-29 LAB — CBC WITH DIFFERENTIAL/PLATELET
Abs Immature Granulocytes: 0.01 10*3/uL (ref 0.00–0.07)
Basophils Absolute: 0 10*3/uL (ref 0.0–0.1)
Basophils Relative: 0 %
Eosinophils Absolute: 0.2 10*3/uL (ref 0.0–0.5)
Eosinophils Relative: 2 %
HCT: 40.7 % (ref 39.0–52.0)
Hemoglobin: 13.1 g/dL (ref 13.0–17.0)
Immature Granulocytes: 0 %
Lymphocytes Relative: 56 %
Lymphs Abs: 4.2 10*3/uL — ABNORMAL HIGH (ref 0.7–4.0)
MCH: 27.3 pg (ref 26.0–34.0)
MCHC: 32.2 g/dL (ref 30.0–36.0)
MCV: 85 fL (ref 80.0–100.0)
Monocytes Absolute: 0.9 10*3/uL (ref 0.1–1.0)
Monocytes Relative: 12 %
Neutro Abs: 2.2 10*3/uL (ref 1.7–7.7)
Neutrophils Relative %: 30 %
Platelets: 263 10*3/uL (ref 150–400)
RBC: 4.79 MIL/uL (ref 4.22–5.81)
RDW: 14.1 % (ref 11.5–15.5)
WBC: 7.6 10*3/uL (ref 4.0–10.5)
nRBC: 0 % (ref 0.0–0.2)

## 2020-09-29 LAB — COMPREHENSIVE METABOLIC PANEL
ALT: 25 U/L (ref 0–44)
AST: 23 U/L (ref 15–41)
Albumin: 4.1 g/dL (ref 3.5–5.0)
Alkaline Phosphatase: 58 U/L (ref 38–126)
Anion gap: 11 (ref 5–15)
BUN: 14 mg/dL (ref 6–20)
CO2: 22 mmol/L (ref 22–32)
Calcium: 9.3 mg/dL (ref 8.9–10.3)
Chloride: 103 mmol/L (ref 98–111)
Creatinine, Ser: 0.71 mg/dL (ref 0.61–1.24)
GFR, Estimated: >60 mL/min (ref >60)
Glucose, Bld: 103 mg/dL — ABNORMAL HIGH (ref 70–99)
Potassium: 3.3 mmol/L — ABNORMAL LOW (ref 3.5–5.1)
Sodium: 136 mmol/L (ref 135–145)
Total Bilirubin: 0.8 mg/dL (ref 0.3–1.2)
Total Protein: 7.9 g/dL (ref 6.5–8.1)

## 2020-09-29 LAB — TSH: TSH: 1.223 u[IU]/mL (ref 0.350–4.500)

## 2020-09-29 LAB — MAGNESIUM: Magnesium: 2 mg/dL (ref 1.7–2.4)

## 2020-09-29 MED ORDER — POTASSIUM CHLORIDE CRYS ER 20 MEQ PO TBCR: 40.0000 meq | EXTENDED_RELEASE_TABLET | Freq: Every day | ORAL | 0 refills | Status: DC
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
## 2020-09-29 NOTE — Discharge Instructions (Signed)
You have been seen in the Emergency Department (ED)  today for numb feeling in the legs.  Your workup and exam have not shown any acute abnormalities and you are likely suffering from muscle strain or possible problems with your discs, but there is no treatment that will fix your symptoms at this time.  Please take Motrin (ibuprofen) as needed for your pain according to the instructions written on the box.  Alternatively, for the next five days you can take 600mg  three times daily with meals (it may upset your stomach).  Please follow up with your doctor as soon as possible regarding today's ED visit and your back pain.  Return to the ED for worsening back pain, fever, worsening weakness or numbness of either leg, or if you develop either (1) an inability to urinate or have bowel movements, or (2) loss of your ability to control your bathroom functions (if you start having "accidents"), or if you develop other new symptoms that concern you.

## 2020-10-23 ENCOUNTER — Encounter: Payer: Self-pay | Admitting: Neurology

## 2020-10-23 ENCOUNTER — Ambulatory Visit: Payer: 59 | Admitting: Neurology

## 2020-10-23 VITALS — BP 126/88 | HR 90 | Ht 71.0 in | Wt 256.4 lb

## 2020-10-23 DIAGNOSIS — R292 Abnormal reflex: Secondary | ICD-10-CM | POA: Insufficient documentation

## 2020-10-23 DIAGNOSIS — R29898 Other symptoms and signs involving the musculoskeletal system: Secondary | ICD-10-CM | POA: Diagnosis not present

## 2020-10-23 DIAGNOSIS — R200 Anesthesia of skin: Secondary | ICD-10-CM | POA: Diagnosis not present

## 2020-10-23 DIAGNOSIS — R260 Ataxic gait: Secondary | ICD-10-CM

## 2020-10-23 HISTORY — DX: Abnormal reflex: R29.2

## 2020-10-23 HISTORY — DX: Ataxic gait: R26.0

## 2020-10-23 HISTORY — DX: Anesthesia of skin: R20.0

## 2020-10-23 HISTORY — DX: Other symptoms and signs involving the musculoskeletal system: R29.898

## 2020-10-23 MED ORDER — GABAPENTIN 300 MG PO CAPS: 300.0000 mg | ORAL_CAPSULE | Freq: Three times a day (TID) | ORAL | 11 refills | Status: DC
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
## 2020-10-27 ENCOUNTER — Telehealth: Payer: Self-pay | Admitting: Neurology

## 2020-10-27 NOTE — Telephone Encounter (Addendum)
[facility] approved MRIs of the cervical spine & thoracic spine. [facility] Berkley Harvey: [id number] (10/27/20- 11/25/20). Sent to [facility].

## 2020-10-28 ENCOUNTER — Encounter (INDEPENDENT_AMBULATORY_CARE_PROVIDER_SITE_OTHER): Payer: 59 | Admitting: Neurology

## 2020-10-28 ENCOUNTER — Ambulatory Visit (INDEPENDENT_AMBULATORY_CARE_PROVIDER_SITE_OTHER): Payer: 59 | Admitting: Neurology

## 2020-10-28 DIAGNOSIS — R29898 Other symptoms and signs involving the musculoskeletal system: Secondary | ICD-10-CM

## 2020-10-28 DIAGNOSIS — R200 Anesthesia of skin: Secondary | ICD-10-CM

## 2020-10-28 DIAGNOSIS — Z0289 Encounter for other administrative examinations: Secondary | ICD-10-CM

## 2020-10-28 NOTE — Progress Notes (Signed)
Full Name:   David Wise Gender: Male MRN #:    [id number] Date of Birth: [birth year]    Visit Date: 10/28/2020 08:02 Age: 50 Years Examining Physician: Despina Arias, MD  Referring Physician: Despina Arias, MD  History: David Wise is a 50 year old man with progressive numbness and clumsiness in the legs over the past few months.  He has paresthesias in the flanks for the past 6 months.  On exam, he has reduced sensation to vibration of 50% at the ankles.  He only feels 5 to 10% at the toes, right slightly worse than left.  Pinprick sensation was mildly reduced in the lower legs.  Strength was 4-4+/5 in the legs, a little worse on the right.  Reflexes were increased with clonus at the ankles, right greater than left.  Plantar responses were flexor.  Nerve conduction studies:  The right peroneal and tibial motor responses had normal distal latencies, amplitudes and conduction velocities.  The tibial F-wave latency was borderline normal for his height.  The sural and superficial peroneal sensory responses on the right had normal peak latencies and amplitudes.  The galvanic sympathetic skin response was normal in the right Wise.  Electromyography: Needle EMG of selected muscles of the right leg was performed.  He had mild chronic denervation in the L5 innervated muscles evaluated.  Additionally, the abductor hallucis muscle in the Wise showed mild chronic denervation.  Impression: This NCV/EMG study shows the following: 1.  No electrophysiologic evidence of a polyneuropathy. 2.  Mild right chronic L5 radiculopathy.  A minimal right chronic S1 radiculopathy cannot be ruled out.  David Hum A. Epimenio Foot, MD, PhD, FAAN Certified in Neurology, Clinical Neurophysiology, Sleep Medicine, Pain Medicine and Neuroimaging Director, [facility]  [facility] [address] [place], [place] [address] [phone]  Verbal informed  consent was obtained from the patient, patient was informed of potential risk of procedure, including bruising, bleeding, hematoma formation, infection, muscle weakness, muscle pain, numbness, among others.  Clinical note: I discussed the above findings with David Wise.  He does not have a polyneuropathy and a mild radiculopathy could explain some discomfort but would not be expected to affect gait significantly.  Therefore, I am most worried about a myelopathy. He is in the process of getting authorized for MRIs of the cervical and thoracic spine to rule out myelopathy. ---RAS        MNC    Nerve / Sites Muscle Latency Ref. Amplitude Ref. Rel Amp Segments Distance Velocity Ref. Area    ms ms mV mV %  cm m/s m/s mVms  R Peroneal - EDB     Ankle EDB 5.0 ?6.5 8.8 ?2.0 100 Ankle - EDB 9   24.1     Fib head EDB 11.9  8.2  93.5 Fib head - Ankle 31 45 ?44 24.6     Pop fossa EDB 14.1  8.1  98.5 Pop fossa - Fib head 10 45 ?44 24.2         Pop fossa - Ankle      R Tibial - AH     Ankle AH 4.9 ?5.8 8.1 ?4.0 100 Ankle - AH 9   15.5     Pop fossa AH 14.8  4.9  61.1 Pop fossa - Ankle 40 41 ?41 13.5         SSR    Nerve / Sites Latency   s  R Sympathetic -  Wise     Wise 2.11       SNC    Nerve / Sites Rec. Site Peak Lat Ref.  Amp Ref. Segments Distance    ms ms V V  cm  R Sural - Ankle (Calf)     Calf Ankle 3.4 ?4.4 7 ?6 Calf - Ankle 14  R Superficial peroneal - Ankle     Lat leg Ankle 3.2 ?4.4 6 ?6 Lat leg - Ankle 14         F  Wave    Nerve F Lat Ref.   ms ms  R Tibial - AH 59.3 ?56.0       EMG Summary Table    Spontaneous MUAP Recruitment  Muscle IA Fib PSW Fasc Other Amp Dur. Poly Pattern  R. Vastus medialis Normal None None None _______ Normal Normal Normal Normal  R. Tibialis anterior Normal None None None _______ Increased Increased 1+ Reduced  R. Peroneus longus Normal None None None _______ Normal Increased 1+ Reduced  R. Gastrocnemius (Medial head) Normal None None None  _______ Normal Normal Normal Normal  R. Abductor hallucis Normal None None None _______ Normal Increased 1+ Reduced  R. Gluteus medius Normal None None None _______ Normal Normal 1+ Reduced  R. Iliopsoas Normal None None None _______ Normal Normal Normal Normal

## 2020-10-30 ENCOUNTER — Telehealth: Payer: Self-pay | Admitting: *Deleted

## 2020-10-30 LAB — MULTIPLE MYELOMA PANEL, SERUM
Albumin SerPl Elph-Mcnc: 4.1 g/dL (ref 2.9–4.4)
Albumin/Glob SerPl: 1.2 (ref 0.7–1.7)
Alpha 1: 0.2 g/dL (ref 0.0–0.4)
Alpha2 Glob SerPl Elph-Mcnc: 0.6 g/dL (ref 0.4–1.0)
B-Globulin SerPl Elph-Mcnc: 1.1 g/dL (ref 0.7–1.3)
Gamma Glob SerPl Elph-Mcnc: 1.6 g/dL (ref 0.4–1.8)
Globulin, Total: 3.6 g/dL (ref 2.2–3.9)
IgA/Immunoglobulin A, Serum: 250 mg/dL (ref 90–386)
IgG (Immunoglobin G), Serum: 1595 mg/dL (ref 603–1613)
IgM (Immunoglobulin M), Srm: 164 mg/dL (ref 20–172)
Total Protein: 7.7 g/dL (ref 6.0–8.5)

## 2020-10-30 LAB — VITAMIN B12: Vitamin B-12: 418 pg/mL (ref 232–1245)

## 2020-10-30 LAB — COPPER, SERUM: Copper: 111 ug/dL (ref 69–132)

## 2020-10-30 NOTE — Telephone Encounter (Signed)
Called spoke w/ pt about lab results per Dr. Bonnita Hollow note. Pt verbalized understanding.

## 2020-10-30 NOTE — Telephone Encounter (Signed)
-----   Message from Asa Lente, MD sent at 10/30/2020 12:42 PM EDT ----- Please let the patient know that the lab work is fine.  I will see him at NCV/EMG

## 2020-11-13 ENCOUNTER — Ambulatory Visit
Admission: RE | Admit: 2020-11-13 | Discharge: 2020-11-13 | Disposition: A | Discharge status: Home / Self Care | Payer: 59 | Source: Ambulatory Visit | Attending: Neurology | Admitting: Neurology

## 2020-11-13 ENCOUNTER — Other Ambulatory Visit: Payer: Self-pay

## 2020-11-13 DIAGNOSIS — R260 Ataxic gait: Secondary | ICD-10-CM | POA: Diagnosis not present

## 2020-11-13 DIAGNOSIS — R200 Anesthesia of skin: Secondary | ICD-10-CM

## 2020-11-13 MED ORDER — GADOBENATE DIMEGLUMINE 529 MG/ML IV SOLN
20.0000 mL | Freq: Once | INTRAVENOUS | Status: AC | PRN
  Administered 2020-11-13: 20 mL via INTRAVENOUS

## 2020-11-17 ENCOUNTER — Telehealth: Payer: Self-pay

## 2020-11-17 ENCOUNTER — Telehealth: Payer: Self-pay | Admitting: Neurology

## 2020-11-17 DIAGNOSIS — R200 Anesthesia of skin: Secondary | ICD-10-CM

## 2020-11-17 DIAGNOSIS — R29898 Other symptoms and signs involving the musculoskeletal system: Secondary | ICD-10-CM

## 2020-11-17 DIAGNOSIS — M4804 Spinal stenosis, thoracic region: Secondary | ICD-10-CM

## 2020-11-17 DIAGNOSIS — R292 Abnormal reflex: Secondary | ICD-10-CM

## 2020-11-17 HISTORY — DX: Spinal stenosis, thoracic region: M48.04

## 2020-11-17 MED ORDER — CYCLOBENZAPRINE HCL 5 MG PO TABS: 5.0000 mg | ORAL_TABLET | Freq: Three times a day (TID) | ORAL | 1 refills | Status: DC | PRN

## 2020-11-17 NOTE — Telephone Encounter (Signed)
Referral sent to [facility]. P: [phone]. 

## 2020-11-17 NOTE — Telephone Encounter (Signed)
Due to progressive numbness (up to mid torso) and gait disturbance over the last 6 months we checked an MRI of the cervical and thoracic spine.  He does have multilevel degenerative changes and could also have congenitally shortened pedicles at some levels.  In the cervical spine, the extent of spinal stenosis was worse at C7-T1 though he had multilevel foraminal narrowing.  In the thoracic spine, he had moderate spinal stenosis at T2-T3 and T3-T4, with severe foraminal narrowing at these levels as well.  There was subtle hyperintense signal within the spinal cord at T3-4.  I discussed with him that I am concerned that the thoracic spine degenerative changes are causing his progressive symptoms over the last 6 months.  I would like him to see a neurosurgeon.  He has not noted much benefit from gabapentin.  I will send in a muscle relaxant as well.

## 2020-12-02 ENCOUNTER — Other Ambulatory Visit: Payer: Self-pay | Admitting: Neurological Surgery

## 2020-12-02 DIAGNOSIS — M4714 Other spondylosis with myelopathy, thoracic region: Secondary | ICD-10-CM

## 2020-12-11 ENCOUNTER — Other Ambulatory Visit: Payer: Self-pay

## 2020-12-11 ENCOUNTER — Ambulatory Visit
Admission: RE | Admit: 2020-12-11 | Discharge: 2020-12-11 | Disposition: A | Discharge status: Home / Self Care | Payer: 59 | Source: Ambulatory Visit | Attending: Neurological Surgery | Admitting: Neurological Surgery

## 2020-12-11 DIAGNOSIS — M4714 Other spondylosis with myelopathy, thoracic region: Secondary | ICD-10-CM

## 2020-12-26 ENCOUNTER — Other Ambulatory Visit: Payer: Self-pay | Admitting: Neurological Surgery

## 2021-01-21 NOTE — Progress Notes (Signed)
Surgical Instructions    Your procedure is scheduled on 02/03/21.  Report to [facility] Main Entrance "A" at 0530 A.M., then check in with the Admitting office.  Call this number if you have problems the morning of surgery:  [phone]   If you have any questions prior to your surgery date call [phone]: Open Monday-Friday 8am-4pm    Remember:  Do not eat or drink after midnight the night before your surgery      Take these medicines the morning of surgery with A SIP OF WATER  amLODipine (NORVASC)   As of today, STOP taking any Aspirin (unless otherwise instructed by your surgeon) Aleve, Naproxen, Ibuprofen, Motrin, Advil, Goody's, BC's, all herbal medications, fish oil, and all vitamins.          Do not wear jewelry or makeup Do not wear lotions, powders, perfumes/colognes, or deodorant. Men may shave face and neck. Do not bring valuables to the hospital.  DO Not wear nail polish, gel polish, artificial nails, or any other type of covering on natural nails  including finger and toenails. If patients have artificial nails, gel coating, etc. that need to be removed by a nail salon please have this removed prior to surgery or surgery may need to be canceled/delayed if the surgeon/ anesthesia feels like the patient is unable to be adequately monitored.             [facility] is not responsible for any belongings or valuables.  Do NOT Smoke (Tobacco/Vaping) or drink Alcohol 24 hours prior to your procedure If you use a CPAP at night, you may bring all equipment for your overnight stay.   Contacts, glasses, dentures or bridgework may not be worn into surgery, please bring cases for these belongings   For patients admitted to the hospital, discharge time will be determined by your treatment team.   Patients discharged the day of surgery will not be allowed to drive home, and someone needs to stay with them for 24 hours.  ONLY 1 SUPPORT PERSON MAY BE PRESENT WHILE YOU ARE IN  SURGERY. IF YOU ARE TO BE ADMITTED ONCE YOU ARE IN YOUR ROOM YOU WILL BE ALLOWED TWO (2) VISITORS.  Minor children may have two parents present. Special consideration for safety and communication needs will be reviewed on a case by case basis.  Special instructions:    Oral Hygiene is also important to reduce your risk of infection.  Remember - BRUSH YOUR TEETH THE MORNING OF SURGERY WITH YOUR REGULAR TOOTHPASTE   [place]- Preparing For Surgery  Before surgery, you can play an important role. Because skin is not sterile, your skin needs to be as free of germs as possible. You can reduce the number of germs on your skin by washing with CHG (chlorahexidine gluconate) Soap before surgery.  CHG is an antiseptic cleaner which kills germs and bonds with the skin to continue killing germs even after washing.     Please do not use if you have an allergy to CHG or antibacterial soaps. If your skin becomes reddened/irritated stop using the CHG.  Do not shave (including legs and underarms) for at least 48 hours prior to first CHG shower. It is OK to shave your face.  Please follow these instructions carefully.     Shower the NIGHT BEFORE SURGERY and the MORNING OF SURGERY with CHG Soap.   If you chose to wash your hair, wash your hair first as usual with your normal shampoo. After  you shampoo, rinse your hair and body thoroughly to remove the shampoo.  Then [employer] and genitals (private parts) with your normal soap and rinse thoroughly to remove soap.  After that Use CHG Soap as you would any other liquid soap. You can apply CHG directly to the skin and wash gently with a scrungie or a clean washcloth.   Apply the CHG Soap to your body ONLY FROM THE NECK DOWN.  Do not use on open wounds or open sores. Avoid contact with your eyes, ears, mouth and genitals (private parts). Wash Face and genitals (private parts)  with your normal soap.   Wash thoroughly, paying special attention to the area where  your surgery will be performed.  Thoroughly rinse your body with warm water from the neck down.  DO NOT shower/wash with your normal soap after using and rinsing off the CHG Soap.  Pat yourself dry with a CLEAN TOWEL.  Wear CLEAN PAJAMAS to bed the night before surgery  Place CLEAN SHEETS on your bed the night before your surgery  DO NOT SLEEP WITH PETS.   Day of Surgery: Take a shower with CHG soap. Wear Clean/Comfortable clothing the morning of surgery Do not apply any deodorants/lotions.   Remember to brush your teeth WITH YOUR REGULAR TOOTHPASTE.   Please read over the following fact sheets that you were given.

## 2021-01-22 ENCOUNTER — Encounter (HOSPITAL_COMMUNITY): Payer: Self-pay

## 2021-01-22 ENCOUNTER — Encounter (HOSPITAL_COMMUNITY)
Admission: RE | Admit: 2021-01-22 | Discharge: 2021-01-22 | Disposition: A | Discharge status: Home / Self Care | Payer: 59 | Source: Ambulatory Visit | Attending: Neurological Surgery | Admitting: Neurological Surgery

## 2021-01-22 ENCOUNTER — Other Ambulatory Visit: Payer: Self-pay

## 2021-01-22 DIAGNOSIS — Z01818 Encounter for other preprocedural examination: Secondary | ICD-10-CM | POA: Insufficient documentation

## 2021-01-22 HISTORY — DX: Type 2 diabetes mellitus without complications: E11.9

## 2021-01-22 HISTORY — DX: Pure hypercholesterolemia, unspecified: E78.00

## 2021-01-22 HISTORY — DX: Prediabetes: R73.03

## 2021-01-22 LAB — CBC
HCT: 45 % (ref 39.0–52.0)
Hemoglobin: 14 g/dL (ref 13.0–17.0)
MCH: 27.1 pg (ref 26.0–34.0)
MCHC: 31.1 g/dL (ref 30.0–36.0)
MCV: 87 fL (ref 80.0–100.0)
Platelets: 274 10*3/uL (ref 150–400)
RBC: 5.17 MIL/uL (ref 4.22–5.81)
RDW: 13.3 % (ref 11.5–15.5)
WBC: 6.2 10*3/uL (ref 4.0–10.5)
nRBC: 0 % (ref 0.0–0.2)

## 2021-01-22 LAB — BASIC METABOLIC PANEL
Anion gap: 9 (ref 5–15)
BUN: 12 mg/dL (ref 6–20)
CO2: 24 mmol/L (ref 22–32)
Calcium: 9.3 mg/dL (ref 8.9–10.3)
Chloride: 104 mmol/L (ref 98–111)
Creatinine, Ser: 0.86 mg/dL (ref 0.61–1.24)
GFR, Estimated: >60 mL/min (ref >60)
Glucose, Bld: 118 mg/dL — ABNORMAL HIGH (ref 70–99)
Potassium: 3.6 mmol/L (ref 3.5–5.1)
Sodium: 137 mmol/L (ref 135–145)

## 2021-01-22 LAB — TYPE AND SCREEN
ABO/RH(D): B POS
Antibody Screen: NEGATIVE

## 2021-01-22 LAB — GLUCOSE, CAPILLARY: Glucose-Capillary: 121 mg/dL — ABNORMAL HIGH (ref 70–99)

## 2021-01-22 LAB — SURGICAL PCR SCREEN
MRSA, PCR: NEGATIVE
Staphylococcus aureus: POSITIVE — AB

## 2021-01-22 LAB — HEMOGLOBIN A1C
Hgb A1c MFr Bld: 6.7 % — ABNORMAL HIGH (ref 4.8–5.6)
Mean Plasma Glucose: 145.59 mg/dL

## 2021-01-22 NOTE — Progress Notes (Addendum)
PCP - denies Cardiologist -denies   Chest x-ray - n/a EKG - 01/22/21 Stress Test - denies ECHO - denies Cardiac Cath - denies  Sleep Study - denies. STOP BANG 7. No PCP to send report to. CPAP - denies  Pt reports being pre-diabetic.  CBG at pre-op 121. Will collect A1C today.  Blood Thinner Instructions: n/a Aspirin Instructions: n/a  COVID TEST- Monday, Sept 12th at 0905. Appt made in Epic.   Anesthesia review: Yes, EKG review  Patient denies shortness of breath, fever, cough and chest pain at PAT appointment   All instructions explained to the patient, with a verbal understanding of the material. Patient agrees to go over the instructions while at home for a better understanding. Patient also instructed to self quarantine after being tested for COVID-19. The opportunity to ask questions was provided.

## 2021-01-23 ENCOUNTER — Encounter (HOSPITAL_COMMUNITY): Payer: Self-pay

## 2021-01-23 ENCOUNTER — Encounter (HOSPITAL_COMMUNITY): Payer: Self-pay | Admitting: Vascular Surgery

## 2021-01-23 DIAGNOSIS — R7303 Prediabetes: Secondary | ICD-10-CM

## 2021-01-23 HISTORY — DX: Prediabetes: R73.03

## 2021-01-23 NOTE — Anesthesia Preprocedure Evaluation (Addendum)
Anesthesia Evaluation  Patient identified by MRN, date of birth, ID band Patient awake    Reviewed: Allergy & Precautions, NPO status , Patient's Chart, lab work & pertinent test results  Airway Mallampati: IV  TM Distance: >3 FB Neck ROM: Full    Dental no notable dental hx.    Pulmonary neg pulmonary ROS,    Pulmonary exam normal breath sounds clear to auscultation       Cardiovascular hypertension, Pt. on medications Normal cardiovascular exam Rhythm:Regular Rate:Normal  ECG: NSR, rate 85   Neuro/Psych negative neurological ROS  negative psych ROS   GI/Hepatic negative GI ROS, Neg liver ROS,   Endo/Other  diabetes  Renal/GU negative Renal ROS     Musculoskeletal negative musculoskeletal ROS (+)   Abdominal (+) + obese,   Peds  Hematology HLD   Anesthesia Other Findings Thoracic myelopathy  Reproductive/Obstetrics                           Anesthesia Physical Anesthesia Plan  ASA: 2  Anesthesia Plan: General   Post-op Pain Management:    Induction: Intravenous  PONV Risk Score and Plan: 2 and Ondansetron, Dexamethasone, Midazolam and Treatment may vary due to age or medical condition  Airway Management Planned: Oral ETT  Additional Equipment:   Intra-op Plan:   Post-operative Plan: Extubation in OR  Informed Consent: I have reviewed the patients History and Physical, chart, labs and discussed the procedure including the risks, benefits and alternatives for the proposed anesthesia with the patient or authorized representative who has indicated his/her understanding and acceptance.     Dental advisory given  Plan Discussed with: CRNA  Anesthesia Plan Comments: (Reviewed PAT note written 01/27/2021 by Shonna Chock, PA-C.  )     Anesthesia Quick Evaluation
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
## 2021-01-30 ENCOUNTER — Ambulatory Visit (INDEPENDENT_AMBULATORY_CARE_PROVIDER_SITE_OTHER): Payer: 59

## 2021-01-30 ENCOUNTER — Other Ambulatory Visit: Payer: Self-pay

## 2021-01-30 DIAGNOSIS — Z23 Encounter for immunization: Secondary | ICD-10-CM | POA: Diagnosis not present

## 2021-01-30 NOTE — Progress Notes (Signed)
    [facility]  Name:  SHAWNDELL Wise    MRN: [id number] DOB: [birth year]   01/30/2021  David Wise was observed post JYNNEOS immunization for 15 minutes without incident. He was provided with Vaccine Information Sheet and instruction to access the V-Safe system.   David Wise was instructed to call 911 with any severe reactions post vaccine: Difficulty breathing  Swelling of face and throat  A fast heartbeat  A bad rash all over body  Dizziness and weakness

## 2021-02-02 ENCOUNTER — Other Ambulatory Visit (HOSPITAL_COMMUNITY)
Admission: RE | Admit: 2021-02-02 | Discharge: 2021-02-02 | Disposition: A | Discharge status: Home / Self Care | Payer: 59 | Source: Ambulatory Visit | Attending: Neurological Surgery | Admitting: Neurological Surgery

## 2021-02-02 DIAGNOSIS — Z01812 Encounter for preprocedural laboratory examination: Secondary | ICD-10-CM | POA: Insufficient documentation

## 2021-02-02 DIAGNOSIS — Z20822 Contact with and (suspected) exposure to covid-19: Secondary | ICD-10-CM | POA: Insufficient documentation

## 2021-02-02 LAB — SARS CORONAVIRUS 2 (TAT 6-24 HRS): SARS Coronavirus 2: NEGATIVE

## 2021-02-03 ENCOUNTER — Inpatient Hospital Stay (HOSPITAL_COMMUNITY): Payer: 59 | Admitting: Vascular Surgery

## 2021-02-03 ENCOUNTER — Other Ambulatory Visit: Payer: Self-pay

## 2021-02-03 ENCOUNTER — Encounter (HOSPITAL_COMMUNITY)
Admission: RE | Disposition: A | Discharge status: Home Health | Payer: Self-pay | Source: Home / Self Care | Attending: Neurological Surgery

## 2021-02-03 ENCOUNTER — Encounter (HOSPITAL_COMMUNITY): Payer: Self-pay | Admitting: Neurological Surgery

## 2021-02-03 ENCOUNTER — Inpatient Hospital Stay (HOSPITAL_COMMUNITY): Payer: 59

## 2021-02-03 ENCOUNTER — Inpatient Hospital Stay (HOSPITAL_COMMUNITY)
Admission: RE | Admit: 2021-02-03 | Discharge: 2021-02-04 | DRG: 460 | Disposition: A | Discharge status: Home Health | Payer: 59 | Attending: Neurological Surgery | Admitting: Neurological Surgery

## 2021-02-03 DIAGNOSIS — Z8616 Personal history of COVID-19: Secondary | ICD-10-CM

## 2021-02-03 DIAGNOSIS — I10 Essential (primary) hypertension: Secondary | ICD-10-CM | POA: Diagnosis present

## 2021-02-03 DIAGNOSIS — M5104 Intervertebral disc disorders with myelopathy, thoracic region: Secondary | ICD-10-CM | POA: Diagnosis present

## 2021-02-03 DIAGNOSIS — Z8249 Family history of ischemic heart disease and other diseases of the circulatory system: Secondary | ICD-10-CM

## 2021-02-03 DIAGNOSIS — M4804 Spinal stenosis, thoracic region: Secondary | ICD-10-CM | POA: Diagnosis present

## 2021-02-03 DIAGNOSIS — E7800 Pure hypercholesterolemia, unspecified: Secondary | ICD-10-CM | POA: Diagnosis present

## 2021-02-03 DIAGNOSIS — E119 Type 2 diabetes mellitus without complications: Secondary | ICD-10-CM | POA: Diagnosis present

## 2021-02-03 DIAGNOSIS — M4714 Other spondylosis with myelopathy, thoracic region: Secondary | ICD-10-CM

## 2021-02-03 DIAGNOSIS — Z20822 Contact with and (suspected) exposure to covid-19: Secondary | ICD-10-CM | POA: Diagnosis present

## 2021-02-03 DIAGNOSIS — Z419 Encounter for procedure for purposes other than remedying health state, unspecified: Secondary | ICD-10-CM

## 2021-02-03 HISTORY — PX: LAMINECTOMY WITH POSTERIOR LATERAL ARTHRODESIS LEVEL 2: SHX6336

## 2021-02-03 HISTORY — DX: Other spondylosis with myelopathy, thoracic region: M47.14

## 2021-02-03 LAB — GLUCOSE, CAPILLARY
Glucose-Capillary: 122 mg/dL — ABNORMAL HIGH (ref 70–99)
Glucose-Capillary: 144 mg/dL — ABNORMAL HIGH (ref 70–99)
Glucose-Capillary: 173 mg/dL — ABNORMAL HIGH (ref 70–99)
Glucose-Capillary: 173 mg/dL — ABNORMAL HIGH (ref 70–99)

## 2021-02-03 LAB — ABO/RH: ABO/RH(D): B POS

## 2021-02-03 SURGERY — LAMINECTOMY WITH POSTERIOR LATERAL ARTHRODESIS LEVEL 2
Anesthesia: General | Site: Back

## 2021-02-03 MED ORDER — LIDOCAINE-EPINEPHRINE 1 %-1:100000 IJ SOLN
INTRAMUSCULAR | Status: DC | PRN
  Administered 2021-02-03: 5 mL

## 2021-02-03 MED ORDER — OXYCODONE HCL 5 MG PO TABS
10.0000 mg | ORAL_TABLET | ORAL | Status: DC | PRN
  Administered 2021-02-03 – 2021-02-04 (×4): 10 mg via ORAL
  Filled 2021-02-03 (×4): qty 2

## 2021-02-03 MED ORDER — ACETAMINOPHEN 650 MG RE SUPP: 650.0000 mg | RECTAL | Status: DC | PRN

## 2021-02-03 MED ORDER — ACETAMINOPHEN 325 MG PO TABS: 650.0000 mg | ORAL_TABLET | ORAL | Status: DC | PRN

## 2021-02-03 MED ORDER — DEXMEDETOMIDINE (PRECEDEX) IN NS 20 MCG/5ML (4 MCG/ML) IV SYRINGE
PREFILLED_SYRINGE | INTRAVENOUS | Status: DC | PRN
  Administered 2021-02-03: 10 ug via INTRAVENOUS

## 2021-02-03 MED ORDER — ATORVASTATIN CALCIUM 40 MG PO TABS
40.0000 mg | ORAL_TABLET | Freq: Every day | ORAL | Status: DC
  Administered 2021-02-03: 40 mg via ORAL
  Filled 2021-02-03: qty 1

## 2021-02-03 MED ORDER — PHENOL 1.4 % MT LIQD: 1.0000 | OROMUCOSAL | Status: DC | PRN

## 2021-02-03 MED ORDER — OXYCODONE HCL 5 MG/5ML PO SOLN: 5.0000 mg | Freq: Once | ORAL | Status: DC | PRN

## 2021-02-03 MED ORDER — THROMBIN 5000 UNITS EX SOLR
CUTANEOUS | Status: AC
  Filled 2021-02-03: qty 5000

## 2021-02-03 MED ORDER — OXYCODONE HCL 5 MG PO TABS
5.0000 mg | ORAL_TABLET | Freq: Once | ORAL | Status: DC | PRN
Start: 2021-02-03 — End: 2021-02-03

## 2021-02-03 MED ORDER — DEXAMETHASONE SODIUM PHOSPHATE 10 MG/ML IJ SOLN
INTRAMUSCULAR | Status: AC
  Filled 2021-02-03: qty 1

## 2021-02-03 MED ORDER — CHLORHEXIDINE GLUCONATE CLOTH 2 % EX PADS
6.0000 | MEDICATED_PAD | Freq: Once | CUTANEOUS | Status: DC
Start: 2021-02-03 — End: 2021-02-03

## 2021-02-03 MED ORDER — ORAL CARE MOUTH RINSE: 15.0000 mL | Freq: Once | OROMUCOSAL | Status: AC

## 2021-02-03 MED ORDER — CEFAZOLIN SODIUM-DEXTROSE 2-4 GM/100ML-% IV SOLN
2.0000 g | INTRAVENOUS | Status: AC
  Administered 2021-02-03: 2 g via INTRAVENOUS
  Filled 2021-02-03: qty 100

## 2021-02-03 MED ORDER — SODIUM CHLORIDE 0.9 % IV SOLN: 250.0000 mL | INTRAVENOUS | Status: DC

## 2021-02-03 MED ORDER — SODIUM CHLORIDE 0.9% FLUSH: 3.0000 mL | Freq: Two times a day (BID) | INTRAVENOUS | Status: DC

## 2021-02-03 MED ORDER — THROMBIN 5000 UNITS EX SOLR
OROMUCOSAL | Status: DC | PRN
  Administered 2021-02-03: 5 mL via TOPICAL

## 2021-02-03 MED ORDER — 0.9 % SODIUM CHLORIDE (POUR BTL) OPTIME
TOPICAL | Status: DC | PRN
  Administered 2021-02-03: 1000 mL

## 2021-02-03 MED ORDER — MIDAZOLAM HCL 2 MG/2ML IJ SOLN
INTRAMUSCULAR | Status: AC
  Filled 2021-02-03: qty 2

## 2021-02-03 MED ORDER — MENTHOL 3 MG MT LOZG: 1.0000 | LOZENGE | OROMUCOSAL | Status: DC | PRN

## 2021-02-03 MED ORDER — PROPOFOL 10 MG/ML IV BOLUS
INTRAVENOUS | Status: DC | PRN
  Administered 2021-02-03: 200 mg via INTRAVENOUS

## 2021-02-03 MED ORDER — ROCURONIUM BROMIDE 10 MG/ML (PF) SYRINGE
PREFILLED_SYRINGE | INTRAVENOUS | Status: AC
  Filled 2021-02-03: qty 10

## 2021-02-03 MED ORDER — MIDAZOLAM HCL 5 MG/5ML IJ SOLN
INTRAMUSCULAR | Status: DC | PRN
  Administered 2021-02-03: 2 mg via INTRAVENOUS

## 2021-02-03 MED ORDER — ROCURONIUM BROMIDE 10 MG/ML (PF) SYRINGE
PREFILLED_SYRINGE | INTRAVENOUS | Status: DC | PRN
  Administered 2021-02-03: 10 mg via INTRAVENOUS
  Administered 2021-02-03 (×3): 20 mg via INTRAVENOUS
  Administered 2021-02-03: 60 mg via INTRAVENOUS
  Administered 2021-02-03: 20 mg via INTRAVENOUS

## 2021-02-03 MED ORDER — LIDOCAINE-EPINEPHRINE 1 %-1:100000 IJ SOLN
INTRAMUSCULAR | Status: AC
  Filled 2021-02-03: qty 1

## 2021-02-03 MED ORDER — LIDOCAINE 2% (20 MG/ML) 5 ML SYRINGE
INTRAMUSCULAR | Status: AC
  Filled 2021-02-03: qty 5

## 2021-02-03 MED ORDER — SUCCINYLCHOLINE CHLORIDE 200 MG/10ML IV SOSY
PREFILLED_SYRINGE | INTRAVENOUS | Status: AC
  Filled 2021-02-03: qty 10

## 2021-02-03 MED ORDER — PROMETHAZINE HCL 25 MG/ML IJ SOLN: 6.2500 mg | INTRAMUSCULAR | Status: DC | PRN

## 2021-02-03 MED ORDER — DOCUSATE SODIUM 100 MG PO CAPS
100.0000 mg | ORAL_CAPSULE | Freq: Two times a day (BID) | ORAL | Status: DC
  Administered 2021-02-03 – 2021-02-04 (×2): 100 mg via ORAL
  Filled 2021-02-03 (×2): qty 1

## 2021-02-03 MED ORDER — LISINOPRIL 20 MG PO TABS
20.0000 mg | ORAL_TABLET | Freq: Every day | ORAL | Status: DC
  Administered 2021-02-03 – 2021-02-04 (×2): 20 mg via ORAL
  Filled 2021-02-03 (×2): qty 1

## 2021-02-03 MED ORDER — PROPOFOL 10 MG/ML IV BOLUS
INTRAVENOUS | Status: AC
  Filled 2021-02-03: qty 40

## 2021-02-03 MED ORDER — PHENYLEPHRINE 40 MCG/ML (10ML) SYRINGE FOR IV PUSH (FOR BLOOD PRESSURE SUPPORT)
PREFILLED_SYRINGE | INTRAVENOUS | Status: DC | PRN
  Administered 2021-02-03 (×2): 160 ug via INTRAVENOUS
  Administered 2021-02-03: 80 ug via INTRAVENOUS

## 2021-02-03 MED ORDER — FENTANYL CITRATE (PF) 250 MCG/5ML IJ SOLN
INTRAMUSCULAR | Status: AC
  Filled 2021-02-03: qty 5

## 2021-02-03 MED ORDER — FENTANYL CITRATE (PF) 100 MCG/2ML IJ SOLN
INTRAMUSCULAR | Status: AC
  Filled 2021-02-03: qty 2

## 2021-02-03 MED ORDER — AMISULPRIDE (ANTIEMETIC) 5 MG/2ML IV SOLN
10.0000 mg | Freq: Once | INTRAVENOUS | Status: DC | PRN
Start: 2021-02-03 — End: 2021-02-03

## 2021-02-03 MED ORDER — CHLORHEXIDINE GLUCONATE CLOTH 2 % EX PADS: 6.0000 | MEDICATED_PAD | Freq: Once | CUTANEOUS | Status: DC

## 2021-02-03 MED ORDER — ACETAMINOPHEN 500 MG PO TABS
ORAL_TABLET | ORAL | Status: AC
  Filled 2021-02-03: qty 2

## 2021-02-03 MED ORDER — OXYCODONE HCL 5 MG PO TABS
5.0000 mg | ORAL_TABLET | ORAL | Status: DC | PRN
  Filled 2021-02-03: qty 1

## 2021-02-03 MED ORDER — CHLORHEXIDINE GLUCONATE 0.12 % MT SOLN
15.0000 mL | Freq: Once | OROMUCOSAL | Status: AC
  Administered 2021-02-03: 15 mL via OROMUCOSAL
  Filled 2021-02-03: qty 15

## 2021-02-03 MED ORDER — PROMETHAZINE HCL 25 MG/ML IJ SOLN
INTRAMUSCULAR | Status: AC
  Filled 2021-02-03: qty 1

## 2021-02-03 MED ORDER — CYCLOBENZAPRINE HCL 10 MG PO TABS
10.0000 mg | ORAL_TABLET | Freq: Every evening | ORAL | Status: DC | PRN
  Administered 2021-02-03: 10 mg via ORAL
  Filled 2021-02-03: qty 1

## 2021-02-03 MED ORDER — LACTATED RINGERS IV SOLN: INTRAVENOUS | Status: DC

## 2021-02-03 MED ORDER — LIDOCAINE 2% (20 MG/ML) 5 ML SYRINGE
INTRAMUSCULAR | Status: DC | PRN
  Administered 2021-02-03: 50 mg via INTRAVENOUS

## 2021-02-03 MED ORDER — SODIUM CHLORIDE 0.9% FLUSH: 3.0000 mL | INTRAVENOUS | Status: DC | PRN

## 2021-02-03 MED ORDER — PHENYLEPHRINE HCL-NACL 20-0.9 MG/250ML-% IV SOLN
INTRAVENOUS | Status: DC | PRN
  Administered 2021-02-03: 40 ug/min via INTRAVENOUS
  Administered 2021-02-03: 15 ug/min via INTRAVENOUS

## 2021-02-03 MED ORDER — BUPIVACAINE HCL (PF) 0.5 % IJ SOLN
INTRAMUSCULAR | Status: DC | PRN
  Administered 2021-02-03: 5 mL

## 2021-02-03 MED ORDER — BUPIVACAINE HCL (PF) 0.5 % IJ SOLN
INTRAMUSCULAR | Status: AC
  Filled 2021-02-03: qty 30

## 2021-02-03 MED ORDER — ALBUMIN HUMAN 5 % IV SOLN
INTRAVENOUS | Status: DC | PRN
Start: 2021-02-03 — End: 2021-02-03

## 2021-02-03 MED ORDER — PHENYLEPHRINE 40 MCG/ML (10ML) SYRINGE FOR IV PUSH (FOR BLOOD PRESSURE SUPPORT)
PREFILLED_SYRINGE | INTRAVENOUS | Status: AC
  Filled 2021-02-03: qty 10

## 2021-02-03 MED ORDER — ONDANSETRON HCL 4 MG/2ML IJ SOLN
INTRAMUSCULAR | Status: DC | PRN
  Administered 2021-02-03: 4 mg via INTRAVENOUS

## 2021-02-03 MED ORDER — AMLODIPINE BESYLATE 5 MG PO TABS
10.0000 mg | ORAL_TABLET | Freq: Every day | ORAL | Status: DC
  Administered 2021-02-04: 10 mg via ORAL
  Filled 2021-02-03: qty 2

## 2021-02-03 MED ORDER — CEFAZOLIN SODIUM-DEXTROSE 2-4 GM/100ML-% IV SOLN
2.0000 g | Freq: Three times a day (TID) | INTRAVENOUS | Status: AC
  Administered 2021-02-03: 2 g via INTRAVENOUS
  Filled 2021-02-03: qty 100

## 2021-02-03 MED ORDER — FENTANYL CITRATE (PF) 250 MCG/5ML IJ SOLN
INTRAMUSCULAR | Status: DC | PRN
  Administered 2021-02-03: 100 ug via INTRAVENOUS
  Administered 2021-02-03 (×2): 50 ug via INTRAVENOUS

## 2021-02-03 MED ORDER — LACTATED RINGERS IV SOLN: INTRAVENOUS | Status: DC | PRN

## 2021-02-03 MED ORDER — ACETAMINOPHEN 500 MG PO TABS
1000.0000 mg | ORAL_TABLET | Freq: Once | ORAL | Status: AC
  Administered 2021-02-03: 1000 mg via ORAL

## 2021-02-03 MED ORDER — HYDROMORPHONE HCL 1 MG/ML IJ SOLN
1.0000 mg | INTRAMUSCULAR | Status: DC | PRN
Start: 2021-02-03 — End: 2021-02-04
  Administered 2021-02-03: 1 mg via INTRAVENOUS
  Filled 2021-02-03: qty 1

## 2021-02-03 MED ORDER — SUGAMMADEX SODIUM 200 MG/2ML IV SOLN
INTRAVENOUS | Status: DC | PRN
  Administered 2021-02-03: 200 mg via INTRAVENOUS

## 2021-02-03 MED ORDER — ONDANSETRON HCL 4 MG/2ML IJ SOLN: 4.0000 mg | Freq: Four times a day (QID) | INTRAMUSCULAR | Status: DC | PRN

## 2021-02-03 MED ORDER — FENTANYL CITRATE (PF) 100 MCG/2ML IJ SOLN
25.0000 ug | INTRAMUSCULAR | Status: DC | PRN
  Administered 2021-02-03 (×2): 50 ug via INTRAVENOUS

## 2021-02-03 MED ORDER — DEXAMETHASONE SODIUM PHOSPHATE 10 MG/ML IJ SOLN
INTRAMUSCULAR | Status: DC | PRN
  Administered 2021-02-03: 10 mg via INTRAVENOUS

## 2021-02-03 MED ORDER — ONDANSETRON HCL 4 MG/2ML IJ SOLN
INTRAMUSCULAR | Status: AC
  Filled 2021-02-03: qty 2

## 2021-02-03 MED ORDER — ONDANSETRON HCL 4 MG PO TABS: 4.0000 mg | ORAL_TABLET | Freq: Four times a day (QID) | ORAL | Status: DC | PRN

## 2021-02-03 MED ORDER — POLYETHYLENE GLYCOL 3350 17 G PO PACK: 17.0000 g | PACK | Freq: Every day | ORAL | Status: DC | PRN

## 2021-02-03 MED ORDER — DEXMEDETOMIDINE (PRECEDEX) IN NS 20 MCG/5ML (4 MCG/ML) IV SYRINGE
PREFILLED_SYRINGE | INTRAVENOUS | Status: AC
  Filled 2021-02-03: qty 5

## 2021-02-03 MED ORDER — HYDROCHLOROTHIAZIDE 25 MG PO TABS
25.0000 mg | ORAL_TABLET | Freq: Every day | ORAL | Status: DC
  Administered 2021-02-03 – 2021-02-04 (×2): 25 mg via ORAL
  Filled 2021-02-03 (×2): qty 1

## 2021-02-03 SURGICAL SUPPLY — 65 items
ADH SKN CLS APL DERMABOND .7 (GAUZE/BANDAGES/DRESSINGS) ×1
APL SKNCLS STERI-STRIP NONHPOA (GAUZE/BANDAGES/DRESSINGS)
BAG COUNTER SPONGE SURGICOUNT (BAG) ×2 IMPLANT
BAG SPNG CNTER NS LX DISP (BAG) ×1
BASKET BONE COLLECTION (BASKET) ×1 IMPLANT
BENZOIN TINCTURE PRP APPL 2/3 (GAUZE/BANDAGES/DRESSINGS) IMPLANT
BLADE CLIPPER SURG (BLADE) ×1 IMPLANT
BLADE SURG 11 STRL SS (BLADE) ×2 IMPLANT
BUR MATCHSTICK NEURO 3.0 LAGG (BURR) ×2 IMPLANT
BUR PRECISION FLUTE 5.0 (BURR) ×2 IMPLANT
CANISTER SUCT 3000ML PPV (MISCELLANEOUS) ×2 IMPLANT
CNTNR URN SCR LID CUP LEK RST (MISCELLANEOUS) ×1 IMPLANT
CONT SPEC 4OZ STRL OR WHT (MISCELLANEOUS) ×2
COVER BACK TABLE 60X90IN (DRAPES) ×2 IMPLANT
DECANTER SPIKE VIAL GLASS SM (MISCELLANEOUS) ×1 IMPLANT
DERMABOND ADVANCED (GAUZE/BANDAGES/DRESSINGS) ×1
DERMABOND ADVANCED .7 DNX12 (GAUZE/BANDAGES/DRESSINGS) ×1 IMPLANT
DRAPE C-ARM 42X72 X-RAY (DRAPES) ×1 IMPLANT
DRAPE C-ARMOR (DRAPES) ×1 IMPLANT
DRAPE LAPAROTOMY 100X72X124 (DRAPES) ×2 IMPLANT
DRAPE SURG 17X23 STRL (DRAPES) ×1 IMPLANT
DURAPREP 26ML APPLICATOR (WOUND CARE) ×2 IMPLANT
ELECT REM PT RETURN 9FT ADLT (ELECTROSURGICAL) ×2
ELECTRODE REM PT RTRN 9FT ADLT (ELECTROSURGICAL) ×1 IMPLANT
GAUZE 4X4 16PLY ~~LOC~~+RFID DBL (SPONGE) ×1 IMPLANT
GAUZE SPONGE 4X4 12PLY STRL (GAUZE/BANDAGES/DRESSINGS) IMPLANT
GLOVE EXAM NITRILE LRG STRL (GLOVE) IMPLANT
GLOVE EXAM NITRILE XL STR (GLOVE) IMPLANT
GLOVE EXAM NITRILE XS STR PU (GLOVE) IMPLANT
GLOVE SURG LTX SZ7.5 (GLOVE) ×4 IMPLANT
GLOVE SURG UNDER POLY LF SZ7.5 (GLOVE) ×4 IMPLANT
GOWN STRL REUS W/ TWL LRG LVL3 (GOWN DISPOSABLE) ×4 IMPLANT
GOWN STRL REUS W/ TWL XL LVL3 (GOWN DISPOSABLE) IMPLANT
GOWN STRL REUS W/TWL 2XL LVL3 (GOWN DISPOSABLE) IMPLANT
GOWN STRL REUS W/TWL LRG LVL3 (GOWN DISPOSABLE) ×4
GOWN STRL REUS W/TWL XL LVL3 (GOWN DISPOSABLE)
HEMOSTAT POWDER KIT SURGIFOAM (HEMOSTASIS) ×2 IMPLANT
KIT BASIN OR (CUSTOM PROCEDURE TRAY) ×2 IMPLANT
KIT POSITION SURG JACKSON T1 (MISCELLANEOUS) ×2 IMPLANT
KIT TURNOVER KIT B (KITS) ×2 IMPLANT
MILL MEDIUM DISP (BLADE) ×1 IMPLANT
NDL HYPO 18GX1.5 BLUNT FILL (NEEDLE) IMPLANT
NDL SPNL 18GX3.5 QUINCKE PK (NEEDLE) IMPLANT
NEEDLE HYPO 18GX1.5 BLUNT FILL (NEEDLE) IMPLANT
NEEDLE HYPO 22GX1.5 SAFETY (NEEDLE) ×2 IMPLANT
NEEDLE SPNL 18GX3.5 QUINCKE PK (NEEDLE) IMPLANT
NS IRRIG 1000ML POUR BTL (IV SOLUTION) ×2 IMPLANT
PACK LAMINECTOMY NEURO (CUSTOM PROCEDURE TRAY) ×2 IMPLANT
PAD ARMBOARD 7.5X6 YLW CONV (MISCELLANEOUS) ×5 IMPLANT
ROD LUMBAR CRVD SOLERA 5.5X55 (Rod) ×2 IMPLANT
SCREW CANN MA 4.5X25 (Screw) ×4 IMPLANT
SCREW SET SOLERA (Screw) ×2 IMPLANT
SCREW SET SOLERA TI5.5 (Screw) IMPLANT
SPONGE SURGIFOAM ABS GEL 100 (HEMOSTASIS) IMPLANT
SPONGE T-LAP 4X18 ~~LOC~~+RFID (SPONGE) ×1 IMPLANT
STRIP CLOSURE SKIN 1/2X4 (GAUZE/BANDAGES/DRESSINGS) IMPLANT
SUT MNCRL AB 3-0 PS2 18 (SUTURE) ×2 IMPLANT
SUT VIC AB 0 CT1 18XCR BRD8 (SUTURE) ×1 IMPLANT
SUT VIC AB 0 CT1 8-18 (SUTURE) ×2
SUT VIC AB 2-0 CP2 18 (SUTURE) ×2 IMPLANT
SYR 3ML LL SCALE MARK (SYRINGE) IMPLANT
TOWEL GREEN STERILE (TOWEL DISPOSABLE) ×2 IMPLANT
TOWEL GREEN STERILE FF (TOWEL DISPOSABLE) ×2 IMPLANT
TRAY FOLEY MTR SLVR 16FR STAT (SET/KITS/TRAYS/PACK) ×2 IMPLANT
WATER STERILE IRR 1000ML POUR (IV SOLUTION) ×2 IMPLANT

## 2021-02-03 NOTE — Progress Notes (Signed)
OT Cancellation Note  Patient Details Name: David Wise MRN: [id number] DOB: [birth year]   Cancelled Treatment:    Reason Eval/Treat Not Completed: Fatigue/lethargy limiting ability to participate (Attempted to see pt, however, pt lethargic and sleepy upon arrival;difficulty keeping his eyes open. Pt reported he recently received pain medication. Pt agreeable to therapy in the morning. Will return in AM for OT evaluation.)   Prudence Davidson, OTS Acute Rehab Office: [phone]   David Wise 02/03/2021, 5:14 PM

## 2021-02-03 NOTE — Transfer of Care (Signed)
Immediate Anesthesia Transfer of Care Note  Patient: David Wise  Procedure(s) Performed: T3-4 laminectomy, transpedicular discectomy, T3-4 possible T3-5 Posterior instrumented fusion (Back)  Patient Location: PACU  Anesthesia Type:General  Level of Consciousness: awake, alert  and oriented  Airway & Oxygen Therapy: Patient Spontanous Breathing  Post-op Assessment: Report given to RN and Patient moving all extremities X 4  Post vital signs: Reviewed and stable  Last Vitals:  Vitals Value Taken Time  BP 138/81 02/03/21 1113  Temp    Pulse 93 02/03/21 1114  Resp 19 02/03/21 1114  SpO2 100 % 02/03/21 1114  Vitals shown include unvalidated device data.  Last Pain:  Vitals:   02/03/21 0617  TempSrc: Oral  PainSc:       Patients Stated Pain Goal: 3 (02/03/21 0604)  Complications: No notable events documented.

## 2021-02-03 NOTE — H&P (Signed)
Surgical H&P Update  HPI: 50 y.o. man with symptoms of severe thoracic myelopathy with poor balance, unsteady gait, bilateral lower extremity numbness and weakness. Radiographic workup revealed thoracic stenosis with cord signal change. No changes in health since he was last seen. Still having the above sx and wishes to proceed with surgery.  PMHx:  Past Medical History:  Diagnosis Date   COVID-19 04/2020   Diabetes mellitus without complication (HCC)    High cholesterol    Hypertension    FamHx:  Family History  Problem Relation Age of Onset   Hypertension Other    SocHx:  reports that he has never smoked. He has never used smokeless tobacco. He reports current alcohol use of about 3.0 standard drinks per week. He reports that he does not use drugs.  Physical Exam: Strength 5/5 in BUE, 4/5 in BLE w/ BLE diffuse numbness, +ankle clonus and reflexes 3+ at the patella  Assesment/Plan: 50 y.o. man with severe thoracic myelopathy, here for T3-4 transpedic discectomy with possible instrumentation. Risks, benefits, and alternatives discussed and the patient would like to continue with surgery.  -OR today -3C post-op  Jadene Pierini, MD 02/03/21 7:01 AM

## 2021-02-03 NOTE — Anesthesia Procedure Notes (Signed)
Procedure Name: Intubation Date/Time: 02/03/2021 7:33 AM Performed by: Lowella Dell, CRNA Pre-anesthesia Checklist: Patient identified, Emergency Drugs available, Suction available and Patient being monitored Patient Re-evaluated:Patient Re-evaluated prior to induction Oxygen Delivery Method: Circle System Utilized Preoxygenation: Pre-oxygenation with 100% oxygen Induction Type: IV induction Ventilation: Mask ventilation without difficulty and Oral airway inserted - appropriate to patient size Laryngoscope Size: Mac and 4 Grade View: Grade I Tube type: Oral Tube size: 7.5 mm Number of attempts: 1 Airway Equipment and Method: Stylet Placement Confirmation: ETT inserted through vocal cords under direct vision, positive ETCO2 and breath sounds checked- equal and bilateral Secured at: 23 cm Tube secured with: Tape Dental Injury: Teeth and Oropharynx as per pre-operative assessment

## 2021-02-03 NOTE — Anesthesia Postprocedure Evaluation (Signed)
Anesthesia Post Note  Patient: David Wise  Procedure(s) Performed: T3-4 laminectomy, transpedicular discectomy, T3-4 possible T3-5 Posterior instrumented fusion (Back)     Patient location during evaluation: PACU Anesthesia Type: General Level of consciousness: awake Pain management: pain level controlled Vital Signs Assessment: post-procedure vital signs reviewed and stable Respiratory status: spontaneous breathing, nonlabored ventilation, respiratory function stable and patient connected to nasal cannula oxygen Cardiovascular status: blood pressure returned to baseline and stable Postop Assessment: no apparent nausea or vomiting Anesthetic complications: no   No notable events documented.  Last Vitals:  Vitals:   02/03/21 1247 02/03/21 1528  BP: (!) 144/77 (!) 144/84  Pulse: 89 84  Resp: 20 17  Temp: 36.6 C 36.4 C  SpO2: 99% 97%    Last Pain:  Vitals:   02/03/21 1630  TempSrc:   PainSc: 3                  David Wise

## 2021-02-03 NOTE — Op Note (Signed)
PATIENT: KELVEN FLATER  DAY OF SURGERY: 02/03/21   PRE-OPERATIVE DIAGNOSIS:  Thoracic myelopathy   POST-OPERATIVE DIAGNOSIS:  Same   PROCEDURE:  T3 laminectomy, T3-4 bilateral transpedicular discectomy, T3-5 posterior instrumented fusion   SURGEON:  Surgeon(s) and Role:    Jadene Pierini, MD - Primary   ANESTHESIA: ETGA   BRIEF HISTORY: This is a 50 year old man who presented with progressive severe thoracic myelopathy. The patient was found to have cord signal change with stenosis at T3-4. I therefore recommended surgical decompression with instrumentation depending on how much pedicle had to be removed. This was discussed with the patient as well as risks, benefits, and alternatives and wished to proceed with surgery.   OPERATIVE DETAIL: The patient was taken to the operating room and anesthesia was induced by the anesthesia team. They were placed on the OR table in the prone position with padding of all pressure points. A formal time out was performed with two patient identifiers and confirmed the operative site. The operative site was marked, hair was clipped with surgical clippers, the area was then prepped and draped in a sterile fashion. Fluoro was used to localize by counting down from the occiput, with the same numbering scheme used on the scout films from the MRI C/T-spine. The operative level was identified and a midline incision was placed to expose from T3 to T5. Subperiosteal dissection was performed bilaterally and fluoroscopy was again used to confirm the surgical level.   Using a combination of high speed drill and rongeurs, decompression was performed, which consisted of a T3 laminectomy followed by bilateral T3-4 facetectomies. There was not sufficient space laterally to access the disc space without manipulating the cord, so I proceeded to remove the bilateral T4 pedicles to access the inferior disc space. The disc was incised and removed with a pituitary laterally to  create a defect in which the central disc components were reduced with downbiting curettes and then removed. There was calcified ligament posteriorly, enlarged facets bilaterally, and a ventral disc herniation, as expected from preop imaging. With the cord well decompressed, decompression was complete and attention was turned to instrumentation.  Instrumentation was then performed. Fluoroscopy was used to guide placement of bilateral pedicle screws ([employer]) at T3 and T5 bilaterally. These were placed by localizing the pedicle with standard landmarks, drilling a pilot hole, cannulating the pedicle with an awl, palpating for pedicle wall breaches, tapping, palpating, and then placing the screw. These were connected with rods bilaterally and final tightened according to manufacturer torque specifications. The bone was thoroughly decorticated over the fusion surface and the previously resected bone fragments were morselized and used as autograft.   All instrument and sponge counts were correct, the incision was then closed in layers. The patient was then returned to anesthesia for emergence. No apparent complications at the completion of the procedure.   EBL:    DRAINS: none   SPECIMENS: none   Jadene Pierini, MD 02/03/21 7:17 AM

## 2021-02-03 NOTE — Progress Notes (Signed)
Neurosurgery Service Post-operative progress note  Assessment & Plan: 50 y.o. man s/p thoracic transpedic discectomy / laminectomy / PSIF, seen in PACU, MAEx4, stable preop weakness / numbness.  -admit to floor -PT/OT -no restrictions in activity, no brace needed -SCDs/TED, SQH POD2 -diet as tolerated  Jadene Pierini  02/03/21 12:07 PM

## 2021-02-04 LAB — GLUCOSE, CAPILLARY: Glucose-Capillary: 140 mg/dL — ABNORMAL HIGH (ref 70–99)

## 2021-02-04 MED ORDER — OXYCODONE HCL 5 MG PO TABS: 5.0000 mg | ORAL_TABLET | ORAL | 0 refills | Status: DC | PRN

## 2021-02-04 MED ORDER — CYCLOBENZAPRINE HCL 10 MG PO TABS: 10.0000 mg | ORAL_TABLET | Freq: Every evening | ORAL | 2 refills | Status: DC | PRN

## 2021-02-04 NOTE — Progress Notes (Signed)
Neurosurgery Service Progress Note  Subjective: No acute events overnight, some back pain, able to ambulate w/ PT this morning   Objective: Vitals:   02/03/21 2102 02/03/21 2340 02/04/21 0300 02/04/21 0739  BP: (!) 161/96 (!) 145/76 127/74 135/68  Pulse: 96 91 87 91  Resp: 18 18 20 18   Temp: 98.8 F (37.1 C) 98.5 F (36.9 C) 98.3 F (36.8 C) 98.1 F (36.7 C)  TempSrc: Oral Oral Oral Oral  SpO2: 100% 98% 100% 98%  Weight:      Height:        Physical Exam: Strength 5/5 in BUE, 4+/5 in BLE, diffuse improving BLE numbness, reflexes 3+ at b/l patella, ankle clonus on R > L  Assessment & Plan: 50 y.o. man s/p T3-4 decompression / transpedic discectomy / 3-5 PSIF, recovering well.  -discharge home today w/ home health  44  02/04/21 9:45 AM

## 2021-02-04 NOTE — Discharge Summary (Signed)
Discharge Summary  Date of Admission: 02/03/2021  Date of Discharge: 02/04/21  Attending Physician: Autumn Patty, MD  Hospital Course: Patient was admitted following an uncomplicated thoracic transpedicular discectomy / decompression and posterior spinal instrumented fusion. He was recovered in PACU and transferred to [facility]. His hospital course was uncomplicated, his neurologic exam was improved on POD1 and the patient was discharged home on 02/04/21 w/ home health. He will follow up in clinic with me in 2 weeks.  Neurologic exam at discharge:  Strength 5/5 in BUE, 4+/5 in BLE, diffuse improving BLE numbness, reflexes 3+ at b/l patella, ankle clonus on R > L  Discharge diagnosis: Thoracic myelopathy  Jadene Pierini, MD 02/04/21 9:49 AM

## 2021-02-04 NOTE — Evaluation (Signed)
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 PMHx significant for DMII and HTN.   Clinical Impression  Pt admitted with above diagnosis. At the time of PT eval, pt was able to demonstrate transfers and ambulation with gross supervision for safety and SPC or RW for support. Pt reports he prefers the RW. Pt was educated on precautions, brace application/wearing schedule, appropriate activity progression, and car transfer. Pt currently with functional limitations due to the deficits listed below (see PT Problem List). Pt will benefit from skilled PT to increase their independence and safety with mobility to allow discharge to the venue listed below.         Recommendations for follow up therapy are one component of a multi-disciplinary discharge planning process, led by the attending physician.  Recommendations may be updated based on patient status, additional functional criteria and insurance authorization.  Follow Up Recommendations Home health PT;Supervision - Intermittent    Equipment Recommendations  Rolling walker with 5" wheels    Recommendations for Other Services       Precautions / Restrictions Precautions Precautions: Back;Fall Precaution Booklet Issued: Yes (comment) Precaution Comments: Reviewed handout and pt was cued for precautions during functional mobility. Pt reports prior falls at home. Required Braces or Orthoses: Spinal Brace Spinal Brace: Lumbar corset;Applied in sitting position Restrictions Weight Bearing Restrictions: No      Mobility  Bed Mobility Overal bed mobility: Needs Assistance Bed Mobility: Rolling;Sidelying to Sit Rolling: Supervision Sidelying to sit: Min guard        General bed mobility comments: Pt was received sitting up in recliner. Verbally reviewed log roll technique.    Transfers Overall transfer level: Needs assistance Equipment used: Straight cane;Rolling walker (2 wheeled) Transfers: Sit to/from Stand Sit to Stand: Supervision         General transfer comment: Light supervision for safety as pt powered up to full stand. No assist required. Heavy reliance on SPC to stand, however pt able to demonstrate controlled lower to chair with hand support on arm rests.  Ambulation/Gait Ambulation/Gait assistance: Supervision Gait Distance (Feet): 400 Feet Assistive device: Rolling walker (2 wheeled);Straight cane Gait Pattern/deviations: Step-through pattern;Decreased stride length;Trunk flexed;Narrow base of support;Decreased step length - right;Decreased step length - left Gait velocity: Decreased Gait velocity interpretation: <1.31 ft/sec, indicative of household ambulator General Gait Details: Very slow and guarded. Initially with SPC. Pt with very light touch of SPC and did not appear to be utilizing for balance. Pt asking to try the RW and reports feeling more comfortable with it. Pt was able to improve step/stride length with RW but continued to complain of pain.  Stairs            Wheelchair Mobility    Modified Rankin (Stroke Patients Only)       Balance Overall balance assessment: Needs assistance Sitting-balance support: Single extremity supported;Feet supported Sitting balance-Leahy Scale: Fair     Standing balance support: Single extremity supported;During functional activity Standing balance-Leahy Scale: Fair Standing balance comment: Able to mobilize with little to no support from [facility].                             Pertinent Vitals/Pain Pain Assessment: Faces Pain Score: 2  Faces Pain  Scale: Hurts little more Pain Location: BLE, R>L Pain Descriptors / Indicators: Tightness;Numbness;Other (Comment)  (Stiff) Pain Intervention(s): Limited activity within patient's tolerance;Monitored during session;Repositioned    Home Living Family/patient expects to be discharged to:: Private residence Living Arrangements: Alone Available Help at Discharge: Family;Available PRN/intermittently Type of Home: House Home Access: Level entry     Home Layout: One level Home Equipment: Cane - single point      Prior Function Level of Independence: Independent with assistive device(s)         Comments: Mod I with use of SPC for last several months PTA. Does not currently work. Patient drives.     Hand Dominance   Dominant Hand: Right    Extremity/Trunk Assessment   Upper Extremity Assessment Upper Extremity Assessment: Defer to OT evaluation    Lower Extremity Assessment Lower Extremity Assessment: Generalized weakness;RLE deficits/detail RLE Deficits / Details: Generalized weakness consistent with pre-op diagnosis. Reports BLE numbness from thighs to feet at baseline. States RLE seemingly worse since surgery.    Cervical / Trunk Assessment Cervical / Trunk Assessment: Other exceptions Cervical / Trunk Exceptions: s/p thoracic surgery.  Communication   Communication: No difficulties  Cognition Arousal/Alertness: Awake/alert Behavior During Therapy: WFL for tasks assessed/performed Overall Cognitive Status: Within Functional Limits for tasks assessed                                        General Comments General comments (skin integrity, edema, etc.): Clean/dry dressing at thoracic spine.    Exercises     Assessment/Plan    PT Assessment Patient needs continued PT services  PT Problem List Decreased strength;Decreased activity tolerance;Decreased balance;Decreased safety awareness;Decreased mobility;Decreased knowledge of use of DME;Decreased knowledge of precautions;Pain       PT Treatment Interventions DME instruction;Gait training;Functional mobility  training;Therapeutic activities;Therapeutic exercise;Neuromuscular re-education;Patient/family education    PT Goals (Current goals can be found in the Care Plan section)  Acute Rehab PT Goals Patient Stated Goal: To decrease discomfort/pain. PT Goal Formulation: With patient Time For Goal Achievement: 02/11/21 Potential to Achieve Goals: Good    Frequency Min 5X/week   Barriers to discharge        Co-evaluation               AM-PAC PT "6 Clicks" Mobility  Outcome Measure Help needed turning from your back to your side while in a flat bed without using bedrails?: None Help needed moving from lying on your back to sitting on the side of a flat bed without using bedrails?: A Little Help needed moving to and from a bed to a chair (including a wheelchair)?: A Little Help needed standing up from a chair using your arms (e.g., wheelchair or bedside chair)?: A Little Help needed to walk in hospital room?: A Little Help needed climbing 3-5 steps with a railing? : A Little 6 Click Score: 19    End of Session Equipment Utilized During Treatment: Gait belt;Back brace Activity Tolerance: Patient limited by pain Patient left: in chair;with call bell/phone within reach Nurse Communication: Mobility status PT Visit Diagnosis: Unsteadiness on feet (R26.81);Pain Pain - part of body:  (back, BLE)    Time: 0865-7846 PT Time Calculation (min) (ACUTE ONLY): 34 min   Charges:   PT Evaluation $PT Eval Low Complexity: 1 Low PT Treatments $Gait Training: 8-22 mins        Conni Slipper, PT, DPT Acute  Rehabilitation Services Pager: [phone] Office: [phone]   Marylynn Pearson 02/04/2021, 10:46 AM

## 2021-02-04 NOTE — Plan of Care (Signed)
Pt given D/C instructions with verbal understanding. Rx's were sent to the pharmacy by MD. Pt's IV was removed prior to D/C. Pt's incision is clean and dry with no sign of infection. Home Health was arranged prior to D/C by TOC. Pt received RW from [employer] per MD order. Pt D/C'd home via wheelchair per MD order. Pt is stable @ D/C and has no other needs at this time. Rema Fendt, RN

## 2021-02-04 NOTE — Evaluation (Signed)
Occupational Therapy Evaluation Patient Details Name: David Wise MRN: [id number] DOB: 21-Mar-1971 Today's Date: 02/04/2021   History of Present Illness 50 y.o. male presenting for elective T3 laminectomy, T3-4 bilateral transpedicular discectomy and T3-5 posterior instrumented fusion seconary to thoracic myelopathy. PMHx significant for DMII and HTN.   Clinical Impression   PTA patient was living alone in a private residence with level entry and was Mod I with ADLs/IADLs with use of SPC. Patient currently presents slightly below baseline level of function demonstrating observed ADLs with close supervision to Min guard grossly with use of SPC. OT provided education on spinal precautions, home set-up to maximize safety and independence with self-care tasks, and acquisition/use of AE. Patient expressed verbal understanding but would benefit from continued education. OT will continue to follow acutely.        Recommendations for follow up therapy are one component of a multi-disciplinary discharge planning process, led by the attending physician.  Recommendations may be updated based on patient status, additional functional criteria and insurance authorization.   Follow Up Recommendations  No OT follow up;Supervision - Intermittent    Equipment Recommendations  None recommended by OT    Recommendations for Other Services       Precautions / Restrictions Precautions Precautions: Back;Fall Precaution Booklet Issued: Yes (comment) Precaution Comments: Written and verbal education provided. Patient demonstrates good carryover during ADLs. Required Braces or Orthoses:  (Order states "no brace needed" but patient has lumbar corset present in room and was told to bring with him on day of surgery. Patient to clarify with MD during rounds this a.m.) Restrictions Weight Bearing Restrictions: No      Mobility Bed Mobility Overal bed mobility: Needs Assistance Bed Mobility: Rolling;Sidelying to  Sit Rolling: Supervision Sidelying to sit: Min guard       General bed mobility comments: Supervision A for rolling to R +rail with HOB slightly elevated. Min guard and increased time/effort to elevate trunk.    Transfers Overall transfer level: Needs assistance Equipment used: Straight cane Transfers: Sit to/from Stand Sit to Stand: Supervision         General transfer comment: Close supervision A for sit to stand from EOB x2 with use of SPC.    Balance Overall balance assessment: Needs assistance Sitting-balance support: Single extremity supported;Feet supported Sitting balance-Leahy Scale: Normal     Standing balance support: Single extremity supported;During functional activity Standing balance-Leahy Scale: Fair Standing balance comment: Able to maintain static standing without UE support during toileting and grooming standing at sink level. Reliant on [facility] with dynamic balance. Reports 3 falls in the last 6 months.                           ADL either performed or assessed with clinical judgement   ADL Overall ADL's : Needs assistance/impaired     Grooming: Supervision/safety;Standing Grooming Details (indicate cue type and reason): 2/3 grooming tasks standing at sink level with supervision A for safety. Upper Body Bathing: Set up   Lower Body Bathing: Supervison/ safety   Upper Body Dressing : Modified independent Upper Body Dressing Details (indicate cue type and reason): Donned UB clothing seated EOB. Lower Body Dressing: Supervision/safety;Sit to/from stand Lower Body Dressing Details (indicate cue type and reason): Able to thread BLE through underwear and pants seated EOB in figure-4 position with increased time/effort. Able to don slip on shoes without external assist. Toilet Transfer: Supervision/safety [facility])   Toileting- Clothing Manipulation and Hygiene:  Supervision/safety Toileting - Clothing Manipulation Details (indicate cue type and  reason): Supervision A for safety.             Vision Patient Visual Report: No change from baseline Vision Assessment?: No apparent visual deficits     Perception     Praxis      Pertinent Vitals/Pain Pain Assessment: 0-10 Pain Score: 2  Pain Location: BLE Pain Descriptors / Indicators: Tightness;Numbness;Other (Comment) (Stiff) Pain Intervention(s): Limited activity within patient's tolerance;Monitored during session;Premedicated before session     Hand Dominance Right   Extremity/Trunk Assessment Upper Extremity Assessment Upper Extremity Assessment: Overall WFL for tasks assessed   Lower Extremity Assessment Lower Extremity Assessment: Defer to PT evaluation (Reports BLE numbness from thighs to feet at baseline seemingly worse since surgery.)   Cervical / Trunk Assessment Cervical / Trunk Assessment: Other exceptions Cervical / Trunk Exceptions: s/p thoracic surgery.   Communication Communication Communication: No difficulties   Cognition Arousal/Alertness: Awake/alert Behavior During Therapy: WFL for tasks assessed/performed Overall Cognitive Status: Within Functional Limits for tasks assessed                                     General Comments  Clean/dry dressing at thoracic spine.    Exercises     Shoulder Instructions      Home Living Family/patient expects to be discharged to:: Private residence Living Arrangements: Alone Available Help at Discharge: Family;Available PRN/intermittently Type of Home: House Home Access: Level entry     Home Layout: One level     Bathroom Shower/Tub: Producer, television/film/video: Standard     Home Equipment: Cane - single point          Prior Functioning/Environment Level of Independence: Independent with assistive device(s)        Comments: Mod I with use of SPC for last several months PTA. Does not currently work. Patient drives.        OT Problem List: Decreased  strength;Impaired balance (sitting and/or standing)      OT Treatment/Interventions: Self-care/ADL training;Therapeutic exercise;Energy conservation;DME and/or AE instruction;Therapeutic activities;Patient/family education;Balance training    OT Goals(Current goals can be found in the care plan section) Acute Rehab OT Goals Patient Stated Goal: To decrease discomfort/pain. OT Goal Formulation: With patient Time For Goal Achievement: 02/18/21 Potential to Achieve Goals: Good ADL Goals Additional ADL Goal #1: Patient will complete ADLs with Mod I, use of AD and good carryover of spinal precautions. Additional ADL Goal #2: Patient will recall 3/3 spinal precautions in prep for ADLs/IADLs.  OT Frequency: Min 2X/week   Barriers to D/C: Decreased caregiver support  Lives alone       Co-evaluation              AM-PAC OT "6 Clicks" Daily Activity     Outcome Measure Help from another person eating meals?: None Help from another person taking care of personal grooming?: A Little Help from another person toileting, which includes using toliet, bedpan, or urinal?: A Little Help from another person bathing (including washing, rinsing, drying)?: A Little Help from another person to put on and taking off regular upper body clothing?: A Little Help from another person to put on and taking off regular lower body clothing?: A Little 6 Click Score: 19   End of Session Equipment Utilized During Treatment: Back brace Nurse Communication: Mobility status  Activity Tolerance: Patient tolerated treatment well Patient  left: in chair;with call bell/phone within reach  OT Visit Diagnosis: Unsteadiness on feet (R26.81);Repeated falls (R29.6);Muscle weakness (generalized) (M62.81)                Time: 8264-1583 OT Time Calculation (min): 30 min Charges:  OT General Charges $OT Visit: 1 Visit OT Evaluation $OT Eval Moderate Complexity: 1 Mod OT Treatments $Self Care/Home Management : 8-22  mins  Eliah Ozawa H. OTR/L Supplemental OT, Department of rehab services [phone]  Ellaina Schuler R H. 02/04/2021, 8:51 AM

## 2021-02-04 NOTE — TOC Progression Note (Signed)
Transition of Care [facility]) - Progression Note    Patient Details  Name: David Wise MRN: [id number] Date of Birth: 1971-03-05  Transition of Care [facility]) CM/SW Contact  Beckie Busing, RN Phone Number:[phone]  02/04/2021, 10:47 AM  Clinical Narrative:    [facility] consulted for [facility] needs. CM at bedside to offer choice. HH has been set up with [facility]. Info has been added to AVS. No other HH needs. TOC will sign off.      Barriers to Discharge: Continued Medical Work up  Expected Discharge Plan and Services           Expected Discharge Date: 02/04/21               DME Arranged: N/A DME Agency: NA       HH Arranged: PT HH Agency: Other - See comment ([facility]) Date HH Agency Contacted: 02/04/21 Time HH Agency Contacted: 1044 Representative spoke with at [facility] Agency: Stacie   Social Determinants of Health (SDOH) Interventions    Readmission Risk Interventions No flowsheet data found.

## 2021-02-05 ENCOUNTER — Encounter (HOSPITAL_COMMUNITY): Payer: Self-pay | Admitting: Neurological Surgery

## 2021-02-23 ENCOUNTER — Emergency Department (HOSPITAL_BASED_OUTPATIENT_CLINIC_OR_DEPARTMENT_OTHER): Payer: 59

## 2021-02-23 ENCOUNTER — Other Ambulatory Visit: Payer: Self-pay

## 2021-02-23 ENCOUNTER — Encounter (HOSPITAL_BASED_OUTPATIENT_CLINIC_OR_DEPARTMENT_OTHER): Payer: Self-pay

## 2021-02-23 ENCOUNTER — Emergency Department (HOSPITAL_BASED_OUTPATIENT_CLINIC_OR_DEPARTMENT_OTHER)
Admission: EM | Admit: 2021-02-23 | Discharge: 2021-02-23 | Disposition: A | Discharge status: Home / Self Care | Payer: 59 | Attending: Emergency Medicine | Admitting: Emergency Medicine

## 2021-02-23 DIAGNOSIS — R2242 Localized swelling, mass and lump, left lower limb: Secondary | ICD-10-CM | POA: Insufficient documentation

## 2021-02-23 DIAGNOSIS — Z8616 Personal history of COVID-19: Secondary | ICD-10-CM | POA: Diagnosis not present

## 2021-02-23 DIAGNOSIS — Z79899 Other long term (current) drug therapy: Secondary | ICD-10-CM | POA: Diagnosis not present

## 2021-02-23 DIAGNOSIS — I10 Essential (primary) hypertension: Secondary | ICD-10-CM | POA: Insufficient documentation

## 2021-02-23 DIAGNOSIS — R0789 Other chest pain: Secondary | ICD-10-CM | POA: Insufficient documentation

## 2021-02-23 DIAGNOSIS — E119 Type 2 diabetes mellitus without complications: Secondary | ICD-10-CM | POA: Insufficient documentation

## 2021-02-23 LAB — COMPREHENSIVE METABOLIC PANEL
ALT: 28 U/L (ref 0–44)
AST: 22 U/L (ref 15–41)
Albumin: 4.3 g/dL (ref 3.5–5.0)
Alkaline Phosphatase: 79 U/L (ref 38–126)
Anion gap: 8 (ref 5–15)
BUN: 10 mg/dL (ref 6–20)
CO2: 28 mmol/L (ref 22–32)
Calcium: 9.5 mg/dL (ref 8.9–10.3)
Chloride: 100 mmol/L (ref 98–111)
Creatinine, Ser: 0.86 mg/dL (ref 0.61–1.24)
GFR, Estimated: >60 mL/min (ref >60)
Glucose, Bld: 104 mg/dL — ABNORMAL HIGH (ref 70–99)
Potassium: 3.9 mmol/L (ref 3.5–5.1)
Sodium: 136 mmol/L (ref 135–145)
Total Bilirubin: 0.7 mg/dL (ref 0.3–1.2)
Total Protein: 8.3 g/dL — ABNORMAL HIGH (ref 6.5–8.1)

## 2021-02-23 LAB — CBC
HCT: 40.1 % (ref 39.0–52.0)
Hemoglobin: 12.8 g/dL — ABNORMAL LOW (ref 13.0–17.0)
MCH: 27.2 pg (ref 26.0–34.0)
MCHC: 31.9 g/dL (ref 30.0–36.0)
MCV: 85.3 fL (ref 80.0–100.0)
Platelets: 399 10*3/uL (ref 150–400)
RBC: 4.7 MIL/uL (ref 4.22–5.81)
RDW: 13.2 % (ref 11.5–15.5)
WBC: 6.4 10*3/uL (ref 4.0–10.5)
nRBC: 0 % (ref 0.0–0.2)

## 2021-02-23 LAB — D-DIMER, QUANTITATIVE: D-Dimer, Quant: 2.34 ug/mL-FEU — ABNORMAL HIGH (ref 0.00–0.50)

## 2021-02-23 LAB — TROPONIN I (HIGH SENSITIVITY): Troponin I (High Sensitivity): 5 ng/L (ref <18)

## 2021-02-23 MED ORDER — IOHEXOL 350 MG/ML SOLN
100.0000 mL | Freq: Once | INTRAVENOUS | Status: AC | PRN
  Administered 2021-02-23: 100 mL via INTRAVENOUS

## 2021-02-23 NOTE — Discharge Instructions (Addendum)
You were seen in the emergency department for evaluation of chest pain.  Your work-up here in the emergency department was reassuringly negative and a CT scan was performed of your chest that was negative for blood clots.  Your CT scan did show a small pulmonary nodule on the left side that will need follow-up by your primary care physician.  Please call your primary care physician to evaluate this image and determine if additional CAT scans will be needed for follow-up.  At this time you are safe for discharge.  Return the emergency department if you have new or worsening chest pain, shortness of breath, vomiting, fever or any other concerning symptoms.

## 2021-02-23 NOTE — ED Triage Notes (Signed)
Pt states had upper back surgery 3 weeks ago. States has been having trouble with intermittent chest pain on right side x 6 months. States since the surgery, the chest pain is getting worse. Denies shortness of breath.

## 2021-02-23 NOTE — ED Notes (Signed)
Per pt request dr was in to speak to pt about results of CT and explain nodule
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
## 2021-03-02 ENCOUNTER — Other Ambulatory Visit: Payer: Self-pay

## 2021-03-02 ENCOUNTER — Ambulatory Visit
Admission: EM | Admit: 2021-03-02 | Discharge: 2021-03-02 | Disposition: A | Discharge status: Home / Self Care | Payer: 59

## 2021-03-02 NOTE — ED Notes (Signed)
David Wise, APP came out to speak to patient and then patient provided resources and left.  Will d/c.

## 2021-04-08 ENCOUNTER — Ambulatory Visit: Payer: 59 | Admitting: Medical

## 2021-04-22 ENCOUNTER — Ambulatory Visit: Payer: 59 | Attending: Neurosurgery

## 2021-04-22 ENCOUNTER — Other Ambulatory Visit: Payer: Self-pay

## 2021-04-22 DIAGNOSIS — Z9889 Other specified postprocedural states: Secondary | ICD-10-CM | POA: Diagnosis present

## 2021-04-22 DIAGNOSIS — M6281 Muscle weakness (generalized): Secondary | ICD-10-CM | POA: Insufficient documentation

## 2021-04-22 DIAGNOSIS — R2681 Unsteadiness on feet: Secondary | ICD-10-CM | POA: Diagnosis present

## 2021-04-22 DIAGNOSIS — M6289 Other specified disorders of muscle: Secondary | ICD-10-CM | POA: Insufficient documentation
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
## 2021-04-22 NOTE — Patient Instructions (Signed)
Aquatic Therapy at [facility]-  What to Expect!  Where:   [facility] [address] [place], [place] [address] Rehab phone [phone]  NOTE:  You will receive an automated phone message reminding you of your appt and it will say the appointment is at the [address] [facility] [address] [facility] clinic.          How to Prepare: Please make sure you drink 8 ounces of water about one hour prior to your pool session A caregiver may attend if needed with the patient to help assist as needed. A caregiver can sit in the pool room on chair. Please arrive IN YOUR SUIT and 15 minutes prior to your appointment - this helps to avoid delays in starting your session. Please make sure to attend to any toileting needs prior to entering the pool Locker rooms for changing are provided.   There is direct access to the pool deck form the locker room.  You can lock your belongings in a locker with lock provided. Once on the pool deck your therapist will ask if you have signed the Patient  Consent and Assignment of Benefits form before beginning treatment Your therapist may take your blood pressure prior to, during and after your session if indicated We usually try and create a home exercise program based on activities we do in the pool.  Please be thinking about who might be able to assist you in the pool should you need to participate in an aquatic home exercise program at the time of discharge if you need assistance.  Some patients do not want to or do not have the ability to participate in an aquatic home program - this is not a barrier in any way to you participating in aquatic therapy as part of your current therapy plan! After Discharge from PT, you can continue using home program at  the [facility]/, there is a drop-in fee for $5 ($45 a month)or for 60 years  or older $4.00 ($40 a month for seniors ) or any local [employer] pool.  Memberships for purchase are  available for gym/pool at [facility]  IT IS VERY IMPORTANT THAT YOUR LAST VISIT BE IN THE CLINIC AT [address] AFTER YOUR LAST AQUATIC VISIT.  PLEASE MAKE SURE THAT YOU HAVE A [address]  APPOINTMENT SCHEDULED.   About the pool: Pool is located approximately 500 FT from the entrance of the building.  Please bring a support person if you need assistance traveling this      distance.   Your therapist will assist you in entering the water; there are two ways to           enter: stairs with railings, and a mechanical lift. Your therapist will determine the most appropriate way for you.  Water temperature is usually between 88-90 degrees  There may be up to 2 other swimmers in the pool at the same time  The pool deck is tile, please wear shoes with good traction if you prefer not to be barefoot.    Contact Info:  For appointment scheduling and cancellations:         Please call the [facility]  PH:[phone]              Aquatic Therapy  Outpatient Rehabilitation @ [facility]       All sessions are 45 minutes                                                    

## 2021-04-29 ENCOUNTER — Ambulatory Visit: Payer: 59 | Attending: Neurological Surgery | Admitting: Physical Therapy

## 2021-04-29 ENCOUNTER — Other Ambulatory Visit: Payer: Self-pay

## 2021-04-29 ENCOUNTER — Encounter: Payer: Self-pay | Admitting: Physical Therapy

## 2021-04-29 DIAGNOSIS — M6289 Other specified disorders of muscle: Secondary | ICD-10-CM | POA: Diagnosis present

## 2021-04-29 DIAGNOSIS — R2681 Unsteadiness on feet: Secondary | ICD-10-CM | POA: Insufficient documentation

## 2021-04-29 DIAGNOSIS — M6281 Muscle weakness (generalized): Secondary | ICD-10-CM | POA: Insufficient documentation

## 2021-04-29 DIAGNOSIS — Z9889 Other specified postprocedural states: Secondary | ICD-10-CM | POA: Insufficient documentation
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
## 2021-04-30 ENCOUNTER — Ambulatory Visit: Payer: 59

## 2021-05-04 ENCOUNTER — Ambulatory Visit: Payer: 59

## 2021-05-04 ENCOUNTER — Other Ambulatory Visit: Payer: Self-pay

## 2021-05-04 DIAGNOSIS — Z9889 Other specified postprocedural states: Secondary | ICD-10-CM

## 2021-05-04 DIAGNOSIS — M6289 Other specified disorders of muscle: Secondary | ICD-10-CM

## 2021-05-04 DIAGNOSIS — R2681 Unsteadiness on feet: Secondary | ICD-10-CM | POA: Diagnosis not present

## 2021-05-04 DIAGNOSIS — M6281 Muscle weakness (generalized): Secondary | ICD-10-CM
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
## 2021-05-06 ENCOUNTER — Ambulatory Visit: Payer: 59

## 2021-05-11 ENCOUNTER — Ambulatory Visit: Payer: 59

## 2021-05-11 ENCOUNTER — Other Ambulatory Visit: Payer: Self-pay

## 2021-05-11 DIAGNOSIS — R2681 Unsteadiness on feet: Secondary | ICD-10-CM | POA: Diagnosis not present
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
## 2021-05-13 ENCOUNTER — Ambulatory Visit: Payer: 59 | Admitting: Physical Therapy

## 2021-05-19 ENCOUNTER — Ambulatory Visit: Payer: 59 | Admitting: Physical Therapy

## 2021-05-20 ENCOUNTER — Ambulatory Visit: Payer: 59 | Admitting: Physical Therapy

## 2021-05-20 ENCOUNTER — Other Ambulatory Visit: Payer: Self-pay

## 2021-05-20 DIAGNOSIS — R2681 Unsteadiness on feet: Secondary | ICD-10-CM

## 2021-05-20 DIAGNOSIS — M6281 Muscle weakness (generalized): Secondary | ICD-10-CM

## 2021-05-20 DIAGNOSIS — Z9889 Other specified postprocedural states: Secondary | ICD-10-CM

## 2021-05-20 DIAGNOSIS — M6289 Other specified disorders of muscle: Secondary | ICD-10-CM
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
## 2021-05-26 ENCOUNTER — Ambulatory Visit: Payer: No Typology Code available for payment source | Admitting: Physical Therapy

## 2021-06-03 ENCOUNTER — Ambulatory Visit: Payer: No Typology Code available for payment source

## 2021-06-03 ENCOUNTER — Telehealth: Payer: Self-pay

## 2021-06-03 NOTE — Telephone Encounter (Signed)
Spoke with patient regarding missed PT appointment. He states that he called yesterday and spoke with one of our front office staff to determine if his new insurance would be accepted, but this was not confirmed one way or the other. Assured patient that I would follow-up regarding his new insurance and have our insurance rep call him back to let him know if he would have coverage for future visits.   Letitia Libra, PT, DPT, ATC 06/03/21 3:54 PM

## 2021-10-07 ENCOUNTER — Other Ambulatory Visit (HOSPITAL_BASED_OUTPATIENT_CLINIC_OR_DEPARTMENT_OTHER): Payer: Self-pay | Admitting: Neurological Surgery

## 2021-10-07 DIAGNOSIS — M4714 Other spondylosis with myelopathy, thoracic region: Secondary | ICD-10-CM

## 2021-10-10 ENCOUNTER — Ambulatory Visit (HOSPITAL_BASED_OUTPATIENT_CLINIC_OR_DEPARTMENT_OTHER): Payer: 59

## 2021-10-17 ENCOUNTER — Ambulatory Visit (HOSPITAL_BASED_OUTPATIENT_CLINIC_OR_DEPARTMENT_OTHER): Payer: 59

## 2021-10-24 ENCOUNTER — Ambulatory Visit (HOSPITAL_BASED_OUTPATIENT_CLINIC_OR_DEPARTMENT_OTHER): Payer: 59 | Attending: Neurological Surgery

## 2022-01-02 ENCOUNTER — Ambulatory Visit (HOSPITAL_BASED_OUTPATIENT_CLINIC_OR_DEPARTMENT_OTHER): Payer: No Typology Code available for payment source

## 2022-02-18 IMAGING — CT CT ANGIO CHEST
2 of 8 series · 19 of 36 positions shown · IV contrast (omnipaque)
Comparison: None.

CLINICAL DATA: PE suspected, high probability

EXAM:
CT ANGIOGRAPHY CHEST WITH CONTRAST
TECHNIQUE: Multidetector CT imaging of the chest was performed using the
standard protocol during bolus administration of intravenous
contrast. Multiplanar CT image reconstructions and MIPs were
obtained to evaluate the vascular anatomy.
CONTRAST:  100mL OMNIPAQUE IOHEXOL 350 MG/ML SOLN

[Series 6: pe thins · axial · 0.70mm/px · z∈[-287,-15]mm · 18 of 305 slices shown]
[im 17/305  lung]
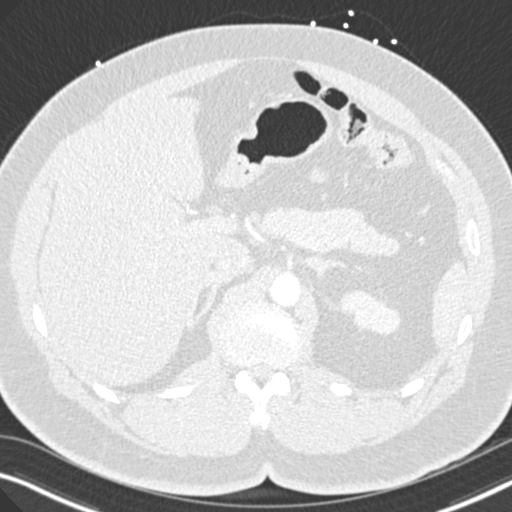
[im 33/305  mediastinal]
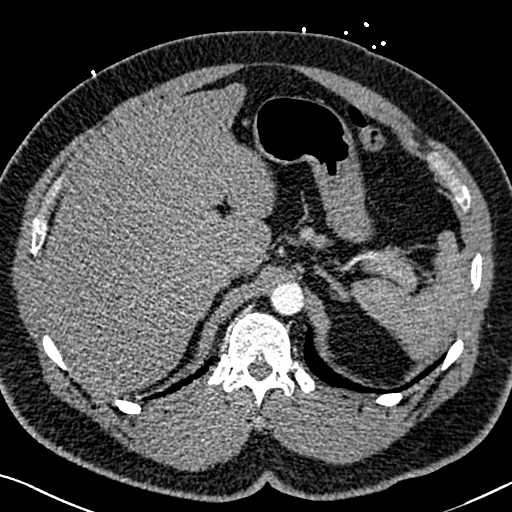
[im 49/305  lung]
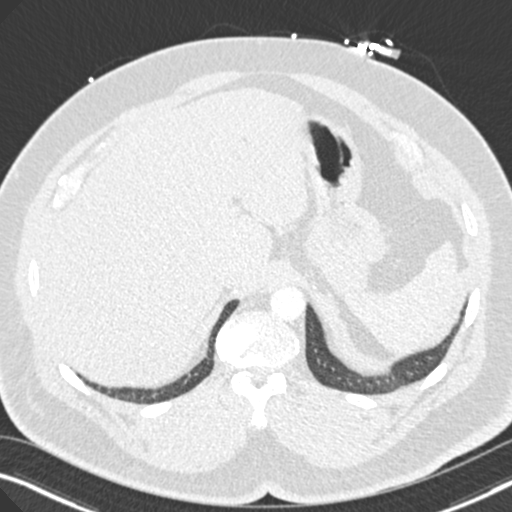
[im 65/305  mediastinal]
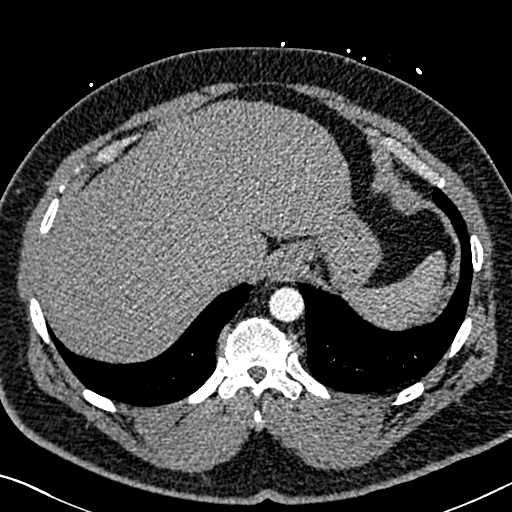
[im 81/305  lung]
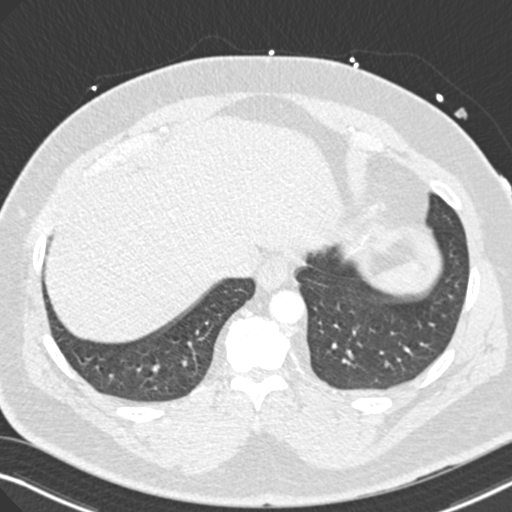
[im 97/305  mediastinal]
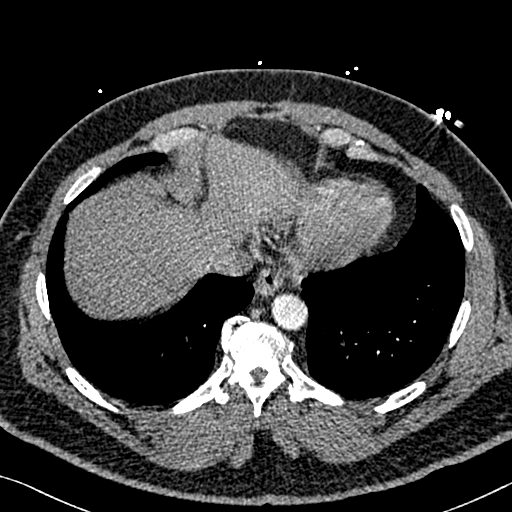
[im 113/305  lung]
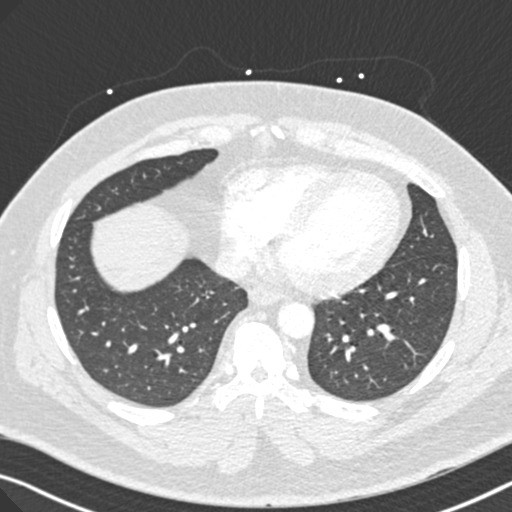
[im 129/305  mediastinal]
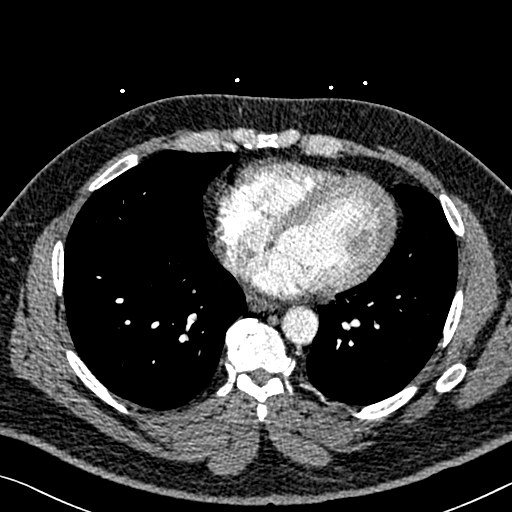
[im 145/305  lung]
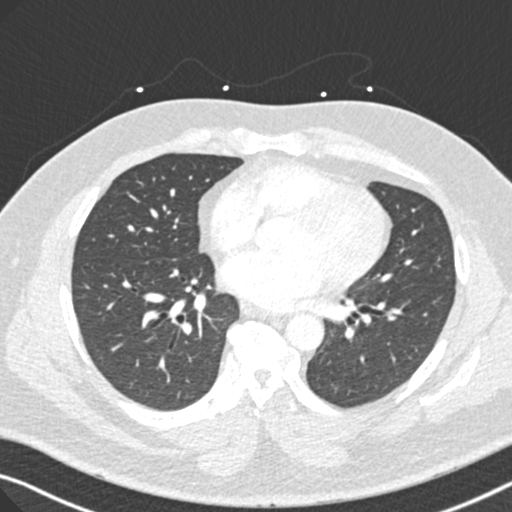
[im 161/305  mediastinal]
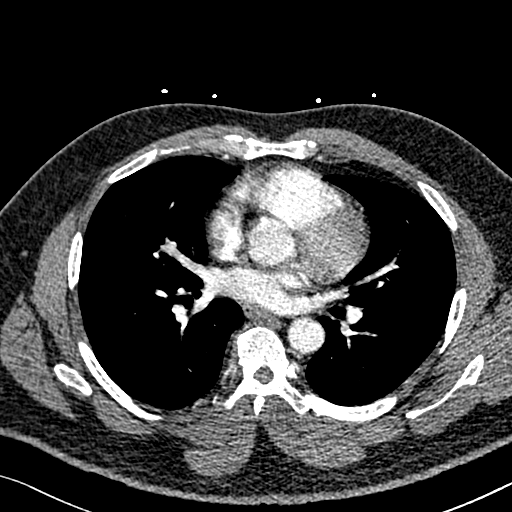
[im 177/305  lung]
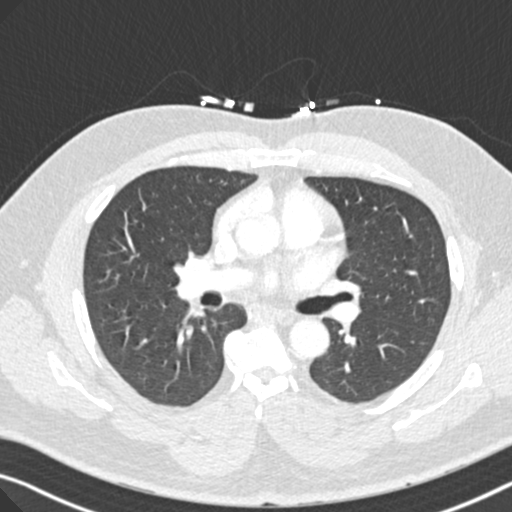
[im 193/305  mediastinal]
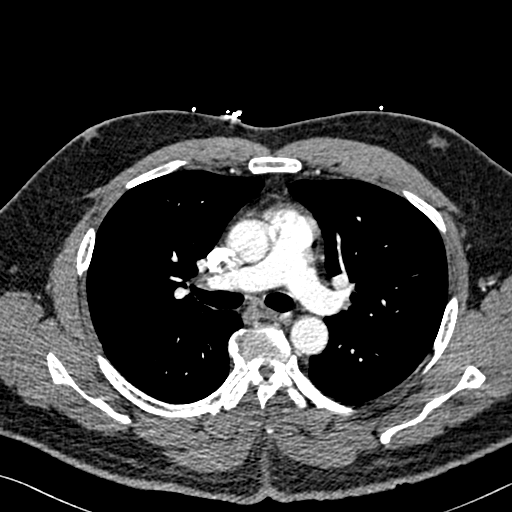
[im 209/305  lung]
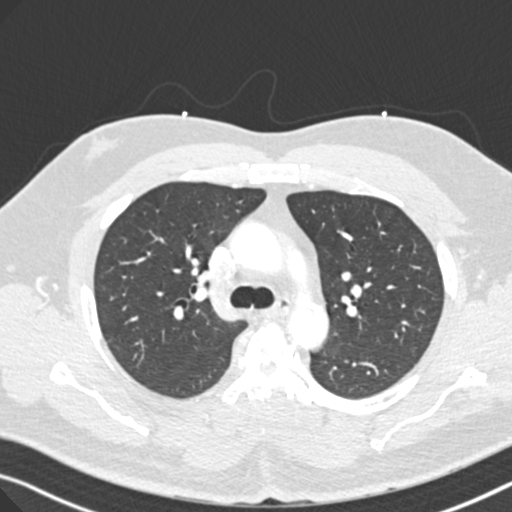
[im 225/305  mediastinal]
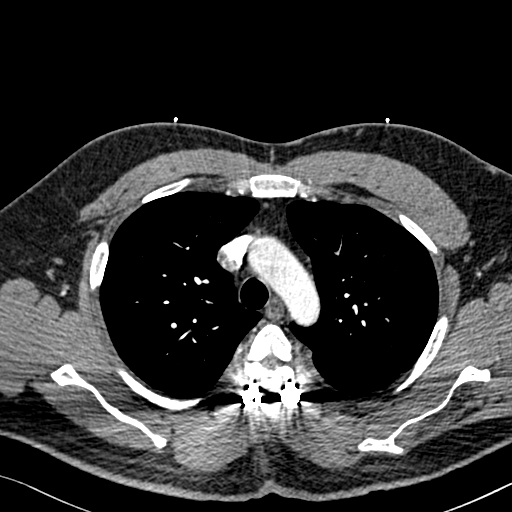
[im 241/305  lung]
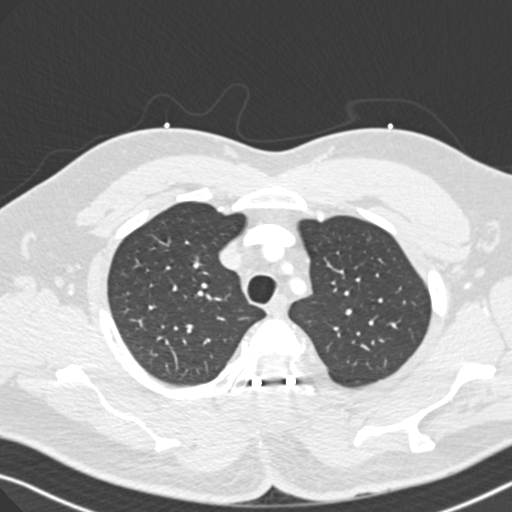
[im 257/305  mediastinal]
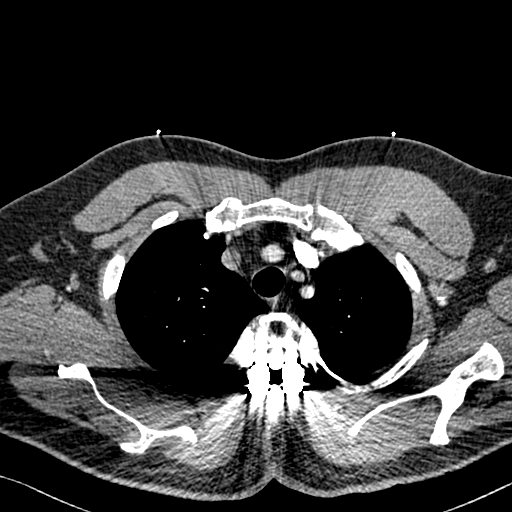
[im 273/305  lung]
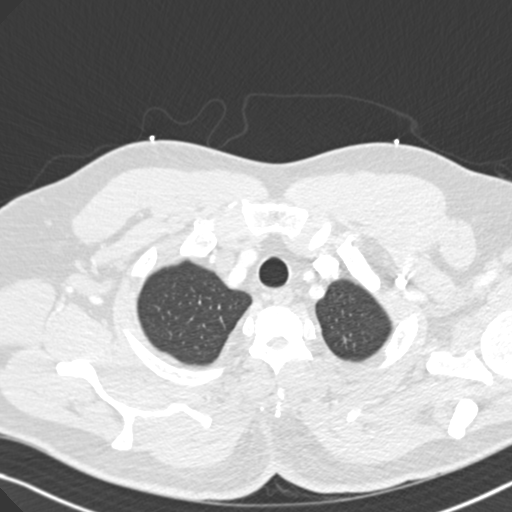
[im 289/305  mediastinal]
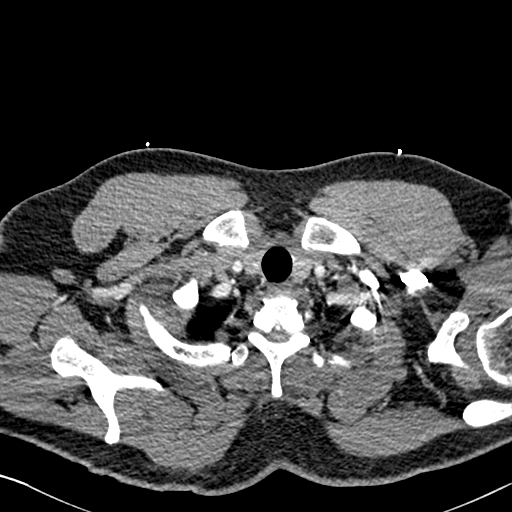

[Series 7: pe coronal mpr · coronal · 0.62mm/px · 1 of 139 slices shown]
[im 70/139  mediastinal]
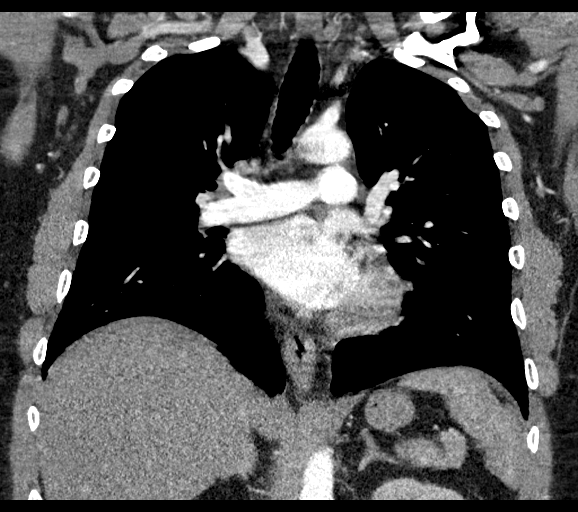

[19 of 36 positions shown; findings below may reference images not displayed]

FINDINGS: Cardiovascular: No filling defects within the pulmonary arteries to
suggest acute pulmonary embolism.

Coronary artery calcification and aortic atherosclerotic
calcification.

Mediastinum/Nodes: No axillary or supraclavicular adenopathy. No
mediastinal or hilar adenopathy. No pericardial fluid. Esophagus
normal.

Lungs/Pleura: No pulmonary infarction. No pneumonia. No
pneumothorax.

3 mm nodule in the RIGHT lower lobe (image 49/series 5

Upper Abdomen: Limited view of the liver, kidneys, pancreas are
unremarkable. Normal adrenal glands.

Musculoskeletal: No aggressive osseous lesion. Posterior thoracic
fusion and decompression from T3 to T5.

Review of the MIP images confirms the above findings.
IMPRESSION: 1. No evidence of acute pulmonary embolism.
2. LEFT lobe pulmonary nodule. No follow-up needed if patient is
low-risk. Non-contrast chest CT can be considered in 12 months if
patient is high-risk. This recommendation follows the consensus
statement: Guidelines for Management of Incidental Pulmonary Nodules
Detected on CT Images: From the [HOSPITAL] 7298; Radiology

## 2022-02-26 ENCOUNTER — Other Ambulatory Visit (HOSPITAL_COMMUNITY): Payer: Self-pay | Admitting: Adult Health Nurse Practitioner

## 2022-03-01 ENCOUNTER — Other Ambulatory Visit (HOSPITAL_COMMUNITY): Payer: Self-pay | Admitting: Neurological Surgery

## 2022-03-01 ENCOUNTER — Other Ambulatory Visit: Payer: No Typology Code available for payment source

## 2022-03-01 ENCOUNTER — Other Ambulatory Visit (HOSPITAL_BASED_OUTPATIENT_CLINIC_OR_DEPARTMENT_OTHER): Payer: Self-pay | Admitting: Neurological Surgery

## 2022-03-01 ENCOUNTER — Other Ambulatory Visit: Payer: Self-pay | Admitting: Neurological Surgery

## 2022-03-01 DIAGNOSIS — M4714 Other spondylosis with myelopathy, thoracic region: Secondary | ICD-10-CM

## 2022-03-05 ENCOUNTER — Ambulatory Visit (HOSPITAL_COMMUNITY): Payer: Self-pay

## 2022-03-09 ENCOUNTER — Ambulatory Visit (HOSPITAL_COMMUNITY): Admission: RE | Admit: 2022-03-09 | Payer: Commercial Managed Care - HMO | Source: Ambulatory Visit

## 2022-04-03 ENCOUNTER — Ambulatory Visit (HOSPITAL_BASED_OUTPATIENT_CLINIC_OR_DEPARTMENT_OTHER)
Admission: RE | Admit: 2022-04-03 | Discharge: 2022-04-03 | Disposition: A | Discharge status: Home / Self Care | Payer: Commercial Managed Care - HMO | Source: Ambulatory Visit | Attending: Neurological Surgery | Admitting: Neurological Surgery

## 2022-04-03 DIAGNOSIS — M4714 Other spondylosis with myelopathy, thoracic region: Secondary | ICD-10-CM | POA: Diagnosis present

## 2022-05-06 ENCOUNTER — Encounter: Payer: Self-pay | Admitting: Physical Medicine and Rehabilitation

## 2022-06-14 ENCOUNTER — Other Ambulatory Visit: Payer: Self-pay

## 2022-06-14 ENCOUNTER — Encounter (HOSPITAL_BASED_OUTPATIENT_CLINIC_OR_DEPARTMENT_OTHER): Payer: Self-pay

## 2022-06-14 ENCOUNTER — Emergency Department (HOSPITAL_BASED_OUTPATIENT_CLINIC_OR_DEPARTMENT_OTHER)
Admission: EM | Admit: 2022-06-14 | Discharge: 2022-06-14 | Disposition: A | Discharge status: Home / Self Care | Payer: Commercial Managed Care - HMO | Attending: Emergency Medicine | Admitting: Emergency Medicine

## 2022-06-14 DIAGNOSIS — M79605 Pain in left leg: Secondary | ICD-10-CM | POA: Diagnosis present

## 2022-06-14 DIAGNOSIS — G6289 Other specified polyneuropathies: Secondary | ICD-10-CM

## 2022-06-14 NOTE — ED Provider Notes (Signed)
[place] [facility]  Provider Note  CSN: [id number] Arrival date & time: 06/14/22 0316  History Chief Complaint  Patient presents with   Leg Pain    David Wise is a 52 y.o. male with history of thoracic spine decompression surgery in Sep 2022 has had residual neuropathy since then, states in the last 24 hours also having some burning in his calves bilaterally that has been coming and going. No new weakness but he does walk with a cane. He is frustrated that his spine surgeon has not been able to give him a definitive time frame for when he will be back to his prior baseline or even if he will ever have improvement. He is scheduled to see outpatient PM&R in March. He has not had any fever or incontinence.    Home Medications Prior to Admission medications   Medication Sig Start Date End Date Taking? Authorizing Provider  amLODipine (NORVASC) 10 MG tablet Take 10 mg by mouth daily.    [provider]  atorvastatin (LIPITOR) 40 MG tablet Take 40 mg by mouth daily in the afternoon. 11/11/20   [provider]  cyclobenzaprine (FLEXERIL) 10 MG tablet Take 1 tablet (10 mg total) by mouth at bedtime as needed for muscle spasms. 02/04/21   Judith Part, MD  hydrochlorothiazide (HYDRODIURIL) 25 MG tablet Take 25 mg by mouth daily.  10/20/17   [provider]  lisinopril (PRINIVIL,ZESTRIL) 20 MG tablet Take 20 mg by mouth daily.  11/25/17   [provider]  oxyCODONE (OXY IR/ROXICODONE) 5 MG immediate release tablet Take 1 tablet (5 mg total) by mouth every 4 (four) hours as needed for moderate pain ((score 4 to 6)). 02/04/21   Judith Part, MD  potassium chloride SA (KLOR-CON) 20 MEQ tablet Take 2 tablets (40 mEq total) by mouth daily for 4 days. Patient not taking: Reported on 01/15/2021 09/29/20 10/03/20  Long, Wonda Olds, MD     Allergies    Patient has no known allergies.   Review of Systems   Review of  Systems Please see HPI for pertinent positives and negatives  Physical Exam BP (!) 147/91   Pulse 88   Temp 98.2 F (36.8 C) (Oral)   Resp 15   Ht 5\' 11"  (1.803 m)   Wt 102.5 kg   SpO2 97%   BMI 31.52 kg/m   Physical Exam Vitals and nursing note reviewed.  HENT:     Head: Normocephalic.     Nose: Nose normal.  Eyes:     Extraocular Movements: Extraocular movements intact.  Pulmonary:     Effort: Pulmonary effort is normal.  Musculoskeletal:        General: Normal range of motion.     Cervical back: Neck supple.  Skin:    Findings: No rash (on exposed skin).  Neurological:     Mental Status: He is alert and oriented to person, place, and time.     Sensory: Sensory deficit (subjective decreased sensation BLE) present.     Motor: No weakness.     Coordination: Coordination normal.  Psychiatric:        Mood and Affect: Mood normal.     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 No significant change today but  he is frustrated with lack of improvement in the ~18 months since his surgery. He declines any gabapentin as he tried that before. He is interested in a referral to a different spine surgeon for a second opinion. Otherwise no acute or emergent condition identified. No signs of acute cord compression.   ED Course       MDM Rules/Calculators/A&P Medical Decision Making Problems Addressed: Other polyneuropathy: chronic illness or injury     Final Clinical Impression(s) / ED Diagnoses Final diagnoses:  Other polyneuropathy    Rx / DC Orders ED Discharge Orders     None        Truddie Hidden, MD 06/14/22 [id number]

## 2022-06-14 NOTE — ED Triage Notes (Signed)
Patient states he had back surgery in 2022. Patient states he is having bilateral leg stiffness and numbness. Yesterday he says his legs started burning.

## 2022-07-29 ENCOUNTER — Telehealth: Payer: Self-pay | Admitting: *Deleted

## 2022-07-29 NOTE — Telephone Encounter (Signed)
Mr Abney called and is asking for a call back. He has not been seen in clinic and must be new pt referral.

## 2022-07-30 ENCOUNTER — Encounter
Payer: Commercial Managed Care - HMO | Attending: Physical Medicine and Rehabilitation | Admitting: Physical Medicine and Rehabilitation

## 2022-07-30 ENCOUNTER — Encounter: Payer: Self-pay | Admitting: Physical Medicine and Rehabilitation

## 2022-07-30 VITALS — BP 137/83 | HR 95 | Ht 71.0 in | Wt 226.0 lb

## 2022-07-30 DIAGNOSIS — R200 Anesthesia of skin: Secondary | ICD-10-CM | POA: Insufficient documentation

## 2022-07-30 DIAGNOSIS — R252 Cramp and spasm: Secondary | ICD-10-CM | POA: Insufficient documentation

## 2022-07-30 DIAGNOSIS — M792 Neuralgia and neuritis, unspecified: Secondary | ICD-10-CM

## 2022-07-30 DIAGNOSIS — M4714 Other spondylosis with myelopathy, thoracic region: Secondary | ICD-10-CM | POA: Insufficient documentation

## 2022-07-30 HISTORY — DX: Cramp and spasm: R25.2

## 2022-07-30 HISTORY — DX: Neuralgia and neuritis, unspecified: M79.2

## 2022-07-30 MED ORDER — DULOXETINE HCL 30 MG PO CPEP: 30.0000 mg | ORAL_CAPSULE | Freq: Every day | ORAL | 5 refills | Status: DC

## 2022-07-30 MED ORDER — BACLOFEN 10 MG PO TABS: 5.0000 mg | ORAL_TABLET | Freq: Three times a day (TID) | ORAL | 0 refills | Status: DC
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
## 2022-08-09 ENCOUNTER — Telehealth: Payer: Self-pay | Admitting: *Deleted

## 2022-08-09 NOTE — Telephone Encounter (Signed)
Patient requesting call back.  Patient states he will start Cymbalta tonight. He is still on Baclofen 5 mg TID.  Pt is a 52 yr old male with hx of prediabetes- A1c 6.0- and thoracic myelopathy with spasticity sent for evaluation for spasticity.  Had surgery on T3/4 decompression- 02/03/21- on thoracic spine because was told needed back surgery or would stop being able to walk. Here for evaluation of SCI/thoracic myelopathy.   1st week- Tin man feeling- Baclofen 5 mg 3x/day - suggest breakfast, lunch and dinner and set an alarm to take- If not strong enough- 10 mg 3x/day-  B. First dose, first hour, be with someone to make sure side effects C. Might need an over the counter stool softener or mild laxative- I Like Senna- .    2. Discussed options- for epidural/steroid injections- I suggest waiting to see how oral medicines work     3.  2nd week-for on fire feeling-  we can try Duloxetine/Cymbalta-  30 mg nightly- x 1 week, then if tolerated, increased to 60 mg QHS-  Most common side effects- nausea x 7 days- mild dry eyes and dry mouth; longer time til orgasms.      4. Educated on thoracic myelopathy- my goal to keep him out of surgery, but might need to go there if cannot get symptoms controlled     5. I have plan A/B/C/D, etc for your symptoms.      6. F/U- give me a call or My chart text me- 1 month   7. F/U in 3 months- for SCI- single appointment

## 2022-08-09 NOTE — Telephone Encounter (Signed)
Called pt back Not having great results with Baclofen so far.  Has some results in AM, but doesn't last the whole day.  Initially said no results, but as we talked longer, is having results, just not enough it sounds like.   Will start Cymbalta tonight- if gets nauseated, asked him to call for anti-nausea medicine- Also can increase Baclofen to 15 mg 1.5 tabs 3x/day- for spasticity.   But wait to increase Baclofen til next week - pt voiced understanding.

## 2022-09-07 ENCOUNTER — Emergency Department (HOSPITAL_BASED_OUTPATIENT_CLINIC_OR_DEPARTMENT_OTHER)
Admission: EM | Admit: 2022-09-07 | Discharge: 2022-09-07 | Disposition: A | Discharge status: Home / Self Care | Payer: Commercial Managed Care - HMO | Attending: Emergency Medicine | Admitting: Emergency Medicine

## 2022-09-07 ENCOUNTER — Emergency Department (HOSPITAL_BASED_OUTPATIENT_CLINIC_OR_DEPARTMENT_OTHER): Payer: Commercial Managed Care - HMO

## 2022-09-07 ENCOUNTER — Encounter (HOSPITAL_BASED_OUTPATIENT_CLINIC_OR_DEPARTMENT_OTHER): Payer: Self-pay

## 2022-09-07 ENCOUNTER — Other Ambulatory Visit: Payer: Self-pay

## 2022-09-07 DIAGNOSIS — Y92410 Unspecified street and highway as the place of occurrence of the external cause: Secondary | ICD-10-CM | POA: Insufficient documentation

## 2022-09-07 DIAGNOSIS — S39012A Strain of muscle, fascia and tendon of lower back, initial encounter: Secondary | ICD-10-CM | POA: Diagnosis not present

## 2022-09-07 DIAGNOSIS — M545 Low back pain, unspecified: Secondary | ICD-10-CM | POA: Diagnosis present

## 2022-09-07 MED ORDER — OXYCODONE-ACETAMINOPHEN 5-325 MG PO TABS: 1.0000 | ORAL_TABLET | Freq: Four times a day (QID) | ORAL | 0 refills | Status: DC | PRN

## 2022-09-07 MED ORDER — METHOCARBAMOL 500 MG PO TABS: 1000.0000 mg | ORAL_TABLET | Freq: Two times a day (BID) | ORAL | 0 refills | Status: AC
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
## 2022-09-07 NOTE — ED Notes (Signed)
Discharge instructions reviewed with patient. Patient verbalizes understanding, no further questions at this time. Medications/prescriptions and follow up information provided. No acute distress noted at time of departure.  
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
## 2022-09-17 ENCOUNTER — Other Ambulatory Visit: Payer: Self-pay

## 2022-09-17 ENCOUNTER — Encounter (HOSPITAL_BASED_OUTPATIENT_CLINIC_OR_DEPARTMENT_OTHER): Payer: Self-pay | Admitting: *Deleted

## 2022-09-17 DIAGNOSIS — Y92410 Unspecified street and highway as the place of occurrence of the external cause: Secondary | ICD-10-CM | POA: Insufficient documentation

## 2022-09-17 DIAGNOSIS — Z79899 Other long term (current) drug therapy: Secondary | ICD-10-CM | POA: Diagnosis not present

## 2022-09-17 DIAGNOSIS — S24109A Unspecified injury at unspecified level of thoracic spinal cord, initial encounter: Secondary | ICD-10-CM | POA: Diagnosis present

## 2022-09-17 DIAGNOSIS — S233XXA Sprain of ligaments of thoracic spine, initial encounter: Secondary | ICD-10-CM | POA: Diagnosis not present

## 2022-09-17 DIAGNOSIS — I10 Essential (primary) hypertension: Secondary | ICD-10-CM | POA: Insufficient documentation

## 2022-09-17 DIAGNOSIS — E119 Type 2 diabetes mellitus without complications: Secondary | ICD-10-CM | POA: Diagnosis not present
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
## 2022-09-18 ENCOUNTER — Emergency Department (HOSPITAL_BASED_OUTPATIENT_CLINIC_OR_DEPARTMENT_OTHER)
Admission: EM | Admit: 2022-09-18 | Discharge: 2022-09-18 | Disposition: A | Discharge status: Home / Self Care | Payer: Commercial Managed Care - HMO | Attending: Emergency Medicine | Admitting: Emergency Medicine

## 2022-09-18 DIAGNOSIS — S233XXA Sprain of ligaments of thoracic spine, initial encounter: Secondary | ICD-10-CM | POA: Diagnosis not present

## 2022-09-18 DIAGNOSIS — S29019D Strain of muscle and tendon of unspecified wall of thorax, subsequent encounter: Secondary | ICD-10-CM

## 2022-09-18 MED ORDER — KETOROLAC TROMETHAMINE 60 MG/2ML IM SOLN
60.0000 mg | Freq: Once | INTRAMUSCULAR | Status: AC
  Administered 2022-09-18: 60 mg via INTRAMUSCULAR
  Filled 2022-09-18: qty 2

## 2022-09-18 MED ORDER — NAPROXEN 500 MG PO TABS: 500.0000 mg | ORAL_TABLET | Freq: Two times a day (BID) | ORAL | 0 refills | Status: DC

## 2022-09-18 MED ORDER — TIZANIDINE HCL 4 MG PO TABS: 4.0000 mg | ORAL_TABLET | Freq: Four times a day (QID) | ORAL | 0 refills | Status: DC | PRN
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
## 2022-09-18 NOTE — Discharge Instructions (Addendum)
Apply ice for 30 minutes at a time, 4 times a day.  In addition to the naproxen and tizanidine which I prescribed for you, you may take acetaminophen as needed for additional pain relief.  Please follow-up with your primary care provider.  You might benefit from physical therapy, and he can order that if he feels that it is appropriate.

## 2022-11-08 ENCOUNTER — Encounter: Payer: Self-pay | Admitting: Physical Medicine and Rehabilitation

## 2022-11-08 ENCOUNTER — Encounter
Payer: Managed Care, Other (non HMO) | Attending: Physical Medicine and Rehabilitation | Admitting: Physical Medicine and Rehabilitation

## 2022-11-08 VITALS — BP 132/81 | HR 92 | Ht 71.0 in | Wt 234.0 lb

## 2022-11-08 DIAGNOSIS — R29898 Other symptoms and signs involving the musculoskeletal system: Secondary | ICD-10-CM

## 2022-11-08 DIAGNOSIS — R260 Ataxic gait: Secondary | ICD-10-CM | POA: Diagnosis present

## 2022-11-08 DIAGNOSIS — R200 Anesthesia of skin: Secondary | ICD-10-CM | POA: Diagnosis present

## 2022-11-08 DIAGNOSIS — R252 Cramp and spasm: Secondary | ICD-10-CM

## 2022-11-08 DIAGNOSIS — M4714 Other spondylosis with myelopathy, thoracic region: Secondary | ICD-10-CM | POA: Diagnosis present

## 2022-11-08 MED ORDER — DULOXETINE HCL 60 MG PO CPEP: 120.0000 mg | ORAL_CAPSULE | Freq: Every day | ORAL | 5 refills | Status: DC

## 2022-11-08 NOTE — Patient Instructions (Addendum)
Pt is a 52 yr old male with hx of prediabetes- A1c 6.0- and thoracic myelopathy with spasticity sent for evaluation for spasticity.  Had surgery on T3/4 decompression- 02/03/21- on thoracic spine because was told needed back surgery or would stop being able to walk. Here for f/u of SCI/thoracic myelopathy   Will look at getting another MRI as of 11/24-  But do not see reason beforehand.    2.  PT Neurorehab- will send to [facility]- for eval and treat  3.  Will con't Baclofen for spasticity since is working, however don't think his squeezing sensation is at level SCI pain.    4. Didn't get Zanaflex filled- uses Cyclobenzaprine sometimes.   5. Will increase Duloxetine/Cymbalta to 120 mg daily- for at level SCI pain and nerve/burning pain. Need a higher dose for at level SCI pain- squeezing pain on legs.    6. Don't think need to increase Baclofen right now- however if tightness doesn't improve then will then increase Baclofen.    7. F/U in 2-3 months- double appt- SCI  8. Cannot fix numbness - it's what makes him fall.   9. Look into foldable cane.
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
## 2022-11-09 ENCOUNTER — Ambulatory Visit: Payer: Managed Care, Other (non HMO) | Attending: Physical Medicine and Rehabilitation

## 2022-11-09 DIAGNOSIS — M6289 Other specified disorders of muscle: Secondary | ICD-10-CM | POA: Insufficient documentation

## 2022-11-09 DIAGNOSIS — R209 Unspecified disturbances of skin sensation: Secondary | ICD-10-CM | POA: Diagnosis present

## 2022-11-09 DIAGNOSIS — Z9889 Other specified postprocedural states: Secondary | ICD-10-CM | POA: Insufficient documentation

## 2022-11-09 DIAGNOSIS — M4714 Other spondylosis with myelopathy, thoracic region: Secondary | ICD-10-CM | POA: Insufficient documentation

## 2022-11-09 DIAGNOSIS — R2689 Other abnormalities of gait and mobility: Secondary | ICD-10-CM | POA: Insufficient documentation
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
## 2022-11-18 ENCOUNTER — Ambulatory Visit: Payer: Managed Care, Other (non HMO)

## 2022-11-29 ENCOUNTER — Ambulatory Visit: Payer: Managed Care, Other (non HMO) | Attending: Physical Medicine and Rehabilitation

## 2023-01-21 ENCOUNTER — Encounter: Payer: Medicare HMO | Attending: Physical Medicine and Rehabilitation | Admitting: Physical Medicine and Rehabilitation

## 2023-01-21 ENCOUNTER — Encounter: Payer: Self-pay | Admitting: Physical Medicine and Rehabilitation

## 2023-01-21 VITALS — BP 133/75 | HR 96 | Ht 71.0 in | Wt 239.8 lb

## 2023-01-21 DIAGNOSIS — M4714 Other spondylosis with myelopathy, thoracic region: Secondary | ICD-10-CM | POA: Diagnosis not present

## 2023-01-21 DIAGNOSIS — G8222 Paraplegia, incomplete: Secondary | ICD-10-CM | POA: Diagnosis not present

## 2023-01-21 DIAGNOSIS — M792 Neuralgia and neuritis, unspecified: Secondary | ICD-10-CM

## 2023-01-21 DIAGNOSIS — R252 Cramp and spasm: Secondary | ICD-10-CM | POA: Insufficient documentation

## 2023-01-21 MED ORDER — BACLOFEN 10 MG PO TABS: 5.0000 mg | ORAL_TABLET | Freq: Three times a day (TID) | ORAL | 5 refills | Status: DC

## 2023-01-21 NOTE — Patient Instructions (Signed)
Pt is a 52 yr old male with hx of prediabetes- A1c 6.0- and thoracic myelopathy with spasticity sent for evaluation for spasticity.  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 Here for f/u of SCI/thoracic myelopathy     1. Thinks, my goal is to make spasticity/tightness ~ 50% better, but will not get to 100% better.    2.  Will restart Baclofen 5 mg 3x/day x 1 week, then 10 mg 3x/day- for muscle tightness/stiffness/spasticity- and then call me in 1 month to see if we need to increase Baclofen. Sent to [facility]- [address]  3. Con't Duloxetine 120 mg daily- for NERVE pain. Has refills.  This is NOT for tightness/stiffness- this is for burning nerve pain- so sounds like it's working.    4.   We discussed Oxycodone that took this year- concern about prescribing- since very strong opiate- and can be addictive and also very constipated.    5.  Encouraged pt about depressive Symptoms. Encouraged that's he's praying.    6.   Call me in 1 month to see if need to increase Baclofen-    7. F/U in 3 months- double appt. SCI
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
## 2023-02-04 ENCOUNTER — Ambulatory Visit: Payer: Commercial Managed Care - HMO | Admitting: Physical Medicine and Rehabilitation

## 2023-02-15 ENCOUNTER — Other Ambulatory Visit (HOSPITAL_BASED_OUTPATIENT_CLINIC_OR_DEPARTMENT_OTHER): Payer: Self-pay | Admitting: Neurological Surgery

## 2023-02-15 DIAGNOSIS — M4714 Other spondylosis with myelopathy, thoracic region: Secondary | ICD-10-CM

## 2023-02-20 ENCOUNTER — Ambulatory Visit (HOSPITAL_BASED_OUTPATIENT_CLINIC_OR_DEPARTMENT_OTHER)
Admission: RE | Admit: 2023-02-20 | Discharge: 2023-02-20 | Disposition: A | Discharge status: Home / Self Care | Payer: Medicare HMO | Source: Ambulatory Visit | Attending: Neurological Surgery | Admitting: Neurological Surgery

## 2023-02-20 DIAGNOSIS — M4714 Other spondylosis with myelopathy, thoracic region: Secondary | ICD-10-CM | POA: Insufficient documentation

## 2023-04-25 ENCOUNTER — Encounter: Payer: Medicare HMO | Attending: Physical Medicine and Rehabilitation | Admitting: Physical Medicine and Rehabilitation

## 2023-04-25 DIAGNOSIS — M792 Neuralgia and neuritis, unspecified: Secondary | ICD-10-CM | POA: Insufficient documentation

## 2023-04-25 DIAGNOSIS — G8222 Paraplegia, incomplete: Secondary | ICD-10-CM | POA: Insufficient documentation

## 2023-04-25 DIAGNOSIS — M4714 Other spondylosis with myelopathy, thoracic region: Secondary | ICD-10-CM | POA: Insufficient documentation

## 2023-04-25 DIAGNOSIS — R252 Cramp and spasm: Secondary | ICD-10-CM | POA: Insufficient documentation

## 2023-05-09 ENCOUNTER — Emergency Department (HOSPITAL_BASED_OUTPATIENT_CLINIC_OR_DEPARTMENT_OTHER)
Admission: EM | Admit: 2023-05-09 | Discharge: 2023-05-09 | Disposition: A | Discharge status: Home / Self Care | Payer: Medicare HMO | Attending: Emergency Medicine | Admitting: Emergency Medicine

## 2023-05-09 ENCOUNTER — Encounter (HOSPITAL_BASED_OUTPATIENT_CLINIC_OR_DEPARTMENT_OTHER): Payer: Self-pay

## 2023-05-09 ENCOUNTER — Emergency Department (HOSPITAL_BASED_OUTPATIENT_CLINIC_OR_DEPARTMENT_OTHER): Payer: Medicare HMO

## 2023-05-09 DIAGNOSIS — E119 Type 2 diabetes mellitus without complications: Secondary | ICD-10-CM | POA: Insufficient documentation

## 2023-05-09 DIAGNOSIS — Z79899 Other long term (current) drug therapy: Secondary | ICD-10-CM | POA: Diagnosis not present

## 2023-05-09 DIAGNOSIS — R002 Palpitations: Secondary | ICD-10-CM | POA: Diagnosis not present

## 2023-05-09 DIAGNOSIS — I10 Essential (primary) hypertension: Secondary | ICD-10-CM | POA: Insufficient documentation

## 2023-05-09 LAB — CBC WITH DIFFERENTIAL/PLATELET
Abs Immature Granulocytes: 0 10*3/uL (ref 0.00–0.07)
Basophils Absolute: 0 10*3/uL (ref 0.0–0.1)
Basophils Relative: 1 %
Eosinophils Absolute: 0.1 10*3/uL (ref 0.0–0.5)
Eosinophils Relative: 3 %
HCT: 42.1 % (ref 39.0–52.0)
Hemoglobin: 13.4 g/dL (ref 13.0–17.0)
Immature Granulocytes: 0 %
Lymphocytes Relative: 56 %
Lymphs Abs: 2.8 10*3/uL (ref 0.7–4.0)
MCH: 27.8 pg (ref 26.0–34.0)
MCHC: 31.8 g/dL (ref 30.0–36.0)
MCV: 87.3 fL (ref 80.0–100.0)
Monocytes Absolute: 0.6 10*3/uL (ref 0.1–1.0)
Monocytes Relative: 12 %
Neutro Abs: 1.3 10*3/uL — ABNORMAL LOW (ref 1.7–7.7)
Neutrophils Relative %: 28 %
Platelets: 253 10*3/uL (ref 150–400)
RBC: 4.82 MIL/uL (ref 4.22–5.81)
RDW: 14.2 % (ref 11.5–15.5)
WBC: 4.8 10*3/uL (ref 4.0–10.5)
nRBC: 0 % (ref 0.0–0.2)

## 2023-05-09 LAB — BASIC METABOLIC PANEL
Anion gap: 8 (ref 5–15)
BUN: 11 mg/dL (ref 6–20)
CO2: 25 mmol/L (ref 22–32)
Calcium: 9.2 mg/dL (ref 8.9–10.3)
Chloride: 103 mmol/L (ref 98–111)
Creatinine, Ser: 0.82 mg/dL (ref 0.61–1.24)
GFR, Estimated: >60 mL/min (ref >60)
Glucose, Bld: 115 mg/dL — ABNORMAL HIGH (ref 70–99)
Potassium: 3.6 mmol/L (ref 3.5–5.1)
Sodium: 136 mmol/L (ref 135–145)

## 2023-05-09 LAB — TROPONIN I (HIGH SENSITIVITY): Troponin I (High Sensitivity): 5 ng/L (ref <18)

## 2023-05-09 LAB — CBG MONITORING, ED: Glucose-Capillary: 109 mg/dL — ABNORMAL HIGH (ref 70–99)

## 2023-05-09 LAB — HEMOGLOBIN A1C
Hgb A1c MFr Bld: 6.2 % — ABNORMAL HIGH (ref 4.8–5.6)
Mean Plasma Glucose: 131.24 mg/dL

## 2023-05-09 NOTE — Discharge Instructions (Signed)
You were seen in the emergency department for feeling off this morning with possibly elevated heart rate.  Your EKG and chest x-ray were unremarkable.  Your blood sugar was mildly elevated at 115.  There was no evidence of any heart injury.  Please continue your regular medications and follow-up with your primary care doctor.  An A1c was sent and this is pending at time of discharge and should result in the next day or so.

## 2023-05-09 NOTE — ED Provider Notes (Signed)
[facility] Provider Wise   CSN: [id number] Arrival date & time: 05/09/23  5366     History  Chief Complaint  Patient presents with   Hypertension    David Wise is a 52 y.o. male.  He has a history of hypertension and borderline diabetes.  He said he did not felt right this morning and so wanted to come get checked out.  He did not have a specific complaint.  He felt his heart maybe was beating a little faster than normal and he felt a little bubbling in his chest.  No headache blurry vision double vision shortness of breath abdominal pain vomiting diarrhea or urinary symptoms.  No numbness or weakness or unsteady gait.  No difficulty with speech or swallowing.  No recent fevers or chills.  He does not smoke cigarettes and only socially drinks, denies any hard drugs.  Takes medication for blood pressure, doctor has been watching his blood sugars.  Not on medication for that  The history is provided by the patient.  Hypertension This is a chronic problem. The problem has not changed since onset.Pertinent negatives include no chest pain, no abdominal pain, no headaches and no shortness of breath.  Palpitations Palpitations quality:  Fast Onset quality:  At rest Timing:  Intermittent Progression:  Resolved Chronicity:  New Relieved by:  None tried Worsened by:  Nothing Ineffective treatments:  None tried Associated symptoms: no chest pain, no diaphoresis, no dizziness, no nausea, no shortness of breath, no syncope and no vomiting        Home Medications Prior to Admission medications   Medication Sig Start Date End Date Taking? Authorizing Provider  amLODipine (NORVASC) 10 MG tablet Take 10 mg by mouth daily.    [provider]  atorvastatin (LIPITOR) 40 MG tablet Take 40 mg by mouth daily in the afternoon. 11/11/20   [provider]  baclofen (LIORESAL) 10 MG tablet Take 0.5 tablets (5 mg total) by mouth 3 (three)  times daily. X 1 week, then 10 mg 3x/day for tin man feeling 01/21/23   Lovorn, Aundra Millet, MD  DULoxetine (CYMBALTA) 60 MG capsule Take 2 capsules (120 mg total) by mouth daily. For at level SCI and nerve pain 11/08/22   Lovorn, Aundra Millet, MD  fenofibrate micronized (LOFIBRA) 200 MG capsule Take 200 mg by mouth daily. 05/20/22   [provider]  hydrochlorothiazide (HYDRODIURIL) 25 MG tablet Take 25 mg by mouth daily.  10/20/17   [provider]  lisinopril (PRINIVIL,ZESTRIL) 20 MG tablet Take 20 mg by mouth daily.  11/25/17   [provider]  naproxen (NAPROSYN) 500 MG tablet Take 1 tablet (500 mg total) by mouth 2 (two) times daily. 09/18/22   Dione Booze, MD  Vitamin D, Ergocalciferol, (DRISDOL) 1.25 MG (50000 UNIT) CAPS capsule Take 50,000 Units by mouth once a week. 05/21/22   [provider]      Allergies    Patient has no known allergies.    Review of Systems   Review of Systems  Constitutional:  Negative for diaphoresis.  Eyes:  Negative for visual disturbance.  Respiratory:  Negative for shortness of breath.   Cardiovascular:  Positive for palpitations. Negative for chest pain and syncope.  Gastrointestinal:  Negative for abdominal pain, nausea and vomiting.  Genitourinary:  Negative for dysuria.  Neurological:  Negative for dizziness and headaches.    Physical Exam Updated Vital Signs BP (!) 162/103 (BP Location: Right Arm)  Pulse 91   Temp 98 F (36.7 C)   Resp 18   Ht 5\' 11"  (1.803 m)   Wt 113.4 kg   SpO2 100%   BMI 34.87 kg/m  Physical Exam Vitals and nursing Wise reviewed.  Constitutional:      General: He is not in acute distress.    Appearance: Normal appearance. He is well-developed.  HENT:     Head: Normocephalic and atraumatic.  Eyes:     Conjunctiva/sclera: Conjunctivae normal.  Cardiovascular:     Rate and Rhythm: Normal rate and regular rhythm.     Heart sounds: No murmur heard. Pulmonary:     Effort: Pulmonary effort is  normal. No respiratory distress.     Breath sounds: Normal breath sounds.  Abdominal:     Palpations: Abdomen is soft.     Tenderness: There is no abdominal tenderness. There is no guarding or rebound.  Musculoskeletal:        General: No deformity.     Cervical back: Neck supple.  Skin:    General: Skin is warm and dry.     Capillary Refill: Capillary refill takes less than 2 seconds.  Neurological:     General: No focal deficit present.     Mental Status: He is alert.     Sensory: No sensory deficit.     Motor: No weakness.     Gait: Gait normal.     ED Results / Procedures / Treatments   Labs (all labs ordered are listed, but only abnormal results are displayed) Labs Reviewed  BASIC METABOLIC PANEL - Abnormal; Notable for the following components:      Result Value   Glucose, Bld 115 (*)    All other components within normal limits  CBC WITH DIFFERENTIAL/PLATELET - Abnormal; Notable for the following components:   Neutro Abs 1.3 (*)    All other components within normal limits  HEMOGLOBIN A1C - Abnormal; Notable for the following components:   Hgb A1c MFr Bld 6.2 (*)    All other components within normal limits  CBG MONITORING, ED - Abnormal; Notable for the following components:   Glucose-Capillary 109 (*)    All other components within normal limits  TROPONIN I (HIGH SENSITIVITY)    EKG EKG Interpretation Date/Time:  Monday May 09 2023 53:66:44 EST Ventricular Rate:  91 PR Interval:  154 QRS Duration:  75 QT Interval:  359 QTC Calculation: 442 R Axis:   32  Text Interpretation: Sinus rhythm Probable left atrial enlargement Abnormal R-wave progression, early transition No significant change since prior 10/22 Confirmed by Meridee Score [phone]) on 05/09/2023 8:34:15 AM  Radiology DG Chest Port 1 View Result Date: 05/09/2023 CLINICAL DATA:  Palpitations. EXAM: PORTABLE CHEST 1 VIEW COMPARISON:  Chest x-ray dated February 23, 2021. FINDINGS: The heart size  and mediastinal contours are within normal limits. Both lungs are clear. Prior T3-T5 posterior fusion. The visualized skeletal structures are otherwise unremarkable. IMPRESSION: No active disease. Electronically Signed   By: Obie Dredge M.D.   On: 05/09/2023 09:02    Procedures Procedures    Medications Ordered in ED Medications - No data to display  ED Course/ Medical Decision Making/ A&P Clinical Course as of 05/09/23 1753  Mon May 09, 2023  2595 Chest x-ray does not show any acute infiltrate.  Awaiting radiology reading. [MB]    Clinical Course User Index [MB] Terrilee Files, MD  Medical Decision Making Amount and/or Complexity of Data Reviewed Labs: ordered. Radiology: ordered.   This patient complains of feeling unwell with possible palpitations; this involves an extensive number of treatment Options and is a complaint that carries with it a high risk of complications and morbidity. The differential includes hyperglycemia, hypertension, hypertensive emergency, ACS, pneumonia, renal failure, metabolic derangement  I ordered, reviewed and interpreted labs, which included CBC normal chemistries normal other than mildly elevated glucose troponins flat I ordered imaging studies which included chest x-ray and I independently    visualized and interpreted imaging which showed no acute findings Previous records obtained and reviewed in epic, last hemoglobin A1c was 6.9 Cardiac monitoring reviewed, normal sinus rhythm Social determinants considered, no significant barriers Critical Interventions: None  After the interventions stated above, I reevaluated the patient and found patient to be asymptomatic and hemodynamically stable Admission and further testing considered, no indications for admission or further workup at this time.  Recommended close follow-up with PCP.  Return instructions discussed         Final Clinical Impression(s) /  ED Diagnoses Final diagnoses:  Primary hypertension  Palpitations    Rx / DC Orders ED Discharge Orders     None         Terrilee Files, MD 05/09/23 1755

## 2023-05-09 NOTE — ED Triage Notes (Signed)
States "felt funny" this morning when he woke up. States he's pre-diabetic and wanted to be checked to make sure he's ok.

## 2023-06-17 ENCOUNTER — Encounter
Payer: Medicare Other | Attending: Physical Medicine and Rehabilitation | Admitting: Physical Medicine and Rehabilitation

## 2023-06-17 ENCOUNTER — Encounter: Payer: Self-pay | Admitting: Physical Medicine and Rehabilitation

## 2023-06-17 VITALS — BP 150/93 | HR 96 | Ht 71.0 in | Wt 258.0 lb

## 2023-06-17 DIAGNOSIS — R292 Abnormal reflex: Secondary | ICD-10-CM | POA: Insufficient documentation

## 2023-06-17 DIAGNOSIS — R252 Cramp and spasm: Secondary | ICD-10-CM | POA: Insufficient documentation

## 2023-06-17 DIAGNOSIS — M4714 Other spondylosis with myelopathy, thoracic region: Secondary | ICD-10-CM | POA: Insufficient documentation

## 2023-06-17 MED ORDER — TIZANIDINE HCL 4 MG PO TABS: 4.0000 mg | ORAL_TABLET | Freq: Two times a day (BID) | ORAL | 5 refills | Status: DC
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
## 2023-06-17 NOTE — Patient Instructions (Addendum)
Pt is a 53 yr old male with hx of prediabetes- A1c 6.0- and thoracic myelopathy with spasticity sent for evaluation for spasticity.  Had surgery on T3/4 decompression- 02/03/21- on thoracic spine because was told needed back surgery or would stop being able to walk. Here for f/u of SCI/thoracic myelopathy    We discussed that I don't think Oxycodone is for spasticity - that won't treat the underlying cause- which is due to spinal cord injury.   2.   Per chart, pt didn't want to increase Baclofen in 10/2022- but had to restart in August- since had stopped.    3. Will try Zanaflex/Tizanidine 4- 8 mg 2x/day for muscle tightness and spasms.    4. F/U in 3 months, but call me in 2-4 weeks to let me know how he's doing.   5. Discussed prognosis- that has gotten back strength wise what he's getting back.    6. Went over last MRI- and how doesn't need new surgery at this time.

## 2023-09-16 ENCOUNTER — Encounter: Payer: Self-pay | Admitting: Physical Medicine and Rehabilitation

## 2023-09-16 ENCOUNTER — Encounter
Payer: Medicare Other | Attending: Physical Medicine and Rehabilitation | Admitting: Physical Medicine and Rehabilitation

## 2023-09-16 VITALS — BP 131/78 | HR 84 | Ht 71.0 in | Wt 241.0 lb

## 2023-09-16 DIAGNOSIS — F5101 Primary insomnia: Secondary | ICD-10-CM | POA: Insufficient documentation

## 2023-09-16 DIAGNOSIS — G894 Chronic pain syndrome: Secondary | ICD-10-CM | POA: Diagnosis not present

## 2023-09-16 DIAGNOSIS — R260 Ataxic gait: Secondary | ICD-10-CM | POA: Insufficient documentation

## 2023-09-16 DIAGNOSIS — M4714 Other spondylosis with myelopathy, thoracic region: Secondary | ICD-10-CM | POA: Diagnosis present

## 2023-09-16 DIAGNOSIS — R252 Cramp and spasm: Secondary | ICD-10-CM | POA: Insufficient documentation

## 2023-09-16 HISTORY — DX: Primary insomnia: F51.01

## 2023-09-16 HISTORY — DX: Chronic pain syndrome: G89.4

## 2023-09-16 MED ORDER — DANTROLENE SODIUM 50 MG PO CAPS: 50.0000 mg | ORAL_CAPSULE | Freq: Every day | ORAL | 5 refills | Status: DC

## 2023-09-16 MED ORDER — TIZANIDINE HCL 4 MG PO TABS: 8.0000 mg | ORAL_TABLET | Freq: Every day | ORAL | 5 refills | Status: AC
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
## 2023-09-16 NOTE — Patient Instructions (Addendum)
 Pt is a 53 yr old male with hx of prediabetes- A1c 6.0- and thoracic myelopathy with spasticity sent for evaluation for spasticity.  Had surgery on T3/4 decompression- 02/03/21- on thoracic spine because was told needed back surgery or would stop being able to walk. Here for f/u of SCI/thoracic myelopathy  MIGHT need surgery in 10-20 years- on back- it's not sure you would, but maybe in far future- doesn't need surgery anytime soon.    2. 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  - so we really need to treat it to help symptoms. They will NOT go away with meds, but will help treat symptoms daily.  Dantrolene  Side effects- loose stools, sedation- 1-2% , and possible mild weakness  Start 50 mg nightly x 1 week Then 50 mg 2x/day x 1 week Then 50 mg in AM and 100 mg nightly-  x 1 week Then 100 mg 2x/day - until dose changed  Needs CMP/LFTs/labs checked in 1 month, 3 months and every 6 months while on medication  Need Dantrolene because failed baclofen - didn't help- zanaflex  helps at night, but cannot tolerate during day- too sedating and spasticity significant- so needs more- no there choices of the 4 ANTISPASMODICS    3. Referral to [facility] for Pain management-  pt is interested in pain meds for his back pain- explained this is different than his SCI and will refer for this. .    4. Con't Zanaflex /Tizanidine - 8 mg nightly- for spasticity- can also help sleep - since spasticity worse at night- and helps him sleep somewhat-    5. Asking for home health aide- explained they.insurance only cover if he has a diagnosis to get covered, - he does not.    6.  If wants Ambien, will need to ask PCP- I don't prescribe   7. F/U in 3 months- double appt- SCI- thoracic myelopathy

## 2023-11-04 ENCOUNTER — Emergency Department (HOSPITAL_BASED_OUTPATIENT_CLINIC_OR_DEPARTMENT_OTHER)
Admission: EM | Admit: 2023-11-04 | Discharge: 2023-11-05 | Disposition: A | Discharge status: Home / Self Care | Attending: Emergency Medicine | Admitting: Emergency Medicine

## 2023-11-04 ENCOUNTER — Encounter (HOSPITAL_BASED_OUTPATIENT_CLINIC_OR_DEPARTMENT_OTHER): Payer: Self-pay | Admitting: Emergency Medicine

## 2023-11-04 ENCOUNTER — Emergency Department (HOSPITAL_BASED_OUTPATIENT_CLINIC_OR_DEPARTMENT_OTHER)

## 2023-11-04 ENCOUNTER — Other Ambulatory Visit: Payer: Self-pay

## 2023-11-04 DIAGNOSIS — Z79899 Other long term (current) drug therapy: Secondary | ICD-10-CM | POA: Insufficient documentation

## 2023-11-04 DIAGNOSIS — R0789 Other chest pain: Secondary | ICD-10-CM | POA: Diagnosis present

## 2023-11-04 DIAGNOSIS — I10 Essential (primary) hypertension: Secondary | ICD-10-CM | POA: Diagnosis not present

## 2023-11-04 DIAGNOSIS — R072 Precordial pain: Secondary | ICD-10-CM | POA: Insufficient documentation

## 2023-11-04 LAB — CBC
HCT: 40.3 % (ref 39.0–52.0)
Hemoglobin: 13 g/dL (ref 13.0–17.0)
MCH: 27.6 pg (ref 26.0–34.0)
MCHC: 32.3 g/dL (ref 30.0–36.0)
MCV: 85.6 fL (ref 80.0–100.0)
Platelets: 245 10*3/uL (ref 150–400)
RBC: 4.71 MIL/uL (ref 4.22–5.81)
RDW: 13.9 % (ref 11.5–15.5)
WBC: 5.8 10*3/uL (ref 4.0–10.5)
nRBC: 0 % (ref 0.0–0.2)

## 2023-11-04 LAB — BASIC METABOLIC PANEL WITH GFR
Anion gap: 11 (ref 5–15)
BUN: 10 mg/dL (ref 6–20)
CO2: 26 mmol/L (ref 22–32)
Calcium: 9.6 mg/dL (ref 8.9–10.3)
Chloride: 102 mmol/L (ref 98–111)
Creatinine, Ser: 0.76 mg/dL (ref 0.61–1.24)
GFR, Estimated: >60 mL/min (ref >60)
Glucose, Bld: 102 mg/dL — ABNORMAL HIGH (ref 70–99)
Potassium: 3.8 mmol/L (ref 3.5–5.1)
Sodium: 140 mmol/L (ref 135–145)

## 2023-11-04 LAB — TROPONIN T, HIGH SENSITIVITY: Troponin T High Sensitivity: <15 ng/L (ref <19)

## 2023-11-04 NOTE — ED Triage Notes (Signed)
 Pt POV steady gait- reports chest pain x3 days, reports its moving around a little bit from left chest to middle. Denies ShOB, n/v.   Denies pain at this time.

## 2023-11-04 NOTE — ED Provider Notes (Signed)
 [name] [facility] DEPARTMENT AT [facility] Provider Note   CSN: [id number] Arrival date & time: 11/04/23  2114     Patient presents with: Chest Pain   David Wise is a 53 y.o. male.  {Add pertinent medical, surgical, social history, OB history to WUJ:81191} The history is provided by the patient.  Chest Pain David Wise is a 53 y.o. male who presents to the Emergency Department complaining of *** Chest pain for two days on left side, central Left pressure, central discomfort. Lasts the whole day. None now. No change with activity, breathing, eating.    No fever, sob.  Abdomen feels tight since spine surgery three years ago. No nausea, vomiting.  No dysuria.  No leg swelling.   Hx/o HTN, predm, HPL.   No tobacco, occasional alcohol, no drugs.  No hx/o DVT/PE.  No hx/o CAD.       Prior to Admission medications   Medication Sig Start Date End Date Taking? Authorizing Provider  amLODipine  (NORVASC ) 10 MG tablet Take 1 tablet by mouth daily. 08/09/23   [provider]  atorvastatin  (LIPITOR) 40 MG tablet Take 1 tablet by mouth daily. 08/01/23   [provider]  cyclobenzaprine  (FLEXERIL ) 10 MG tablet Take 10 mg by mouth daily as needed.    [provider]  dantrolene  (DANTRIUM ) 50 MG capsule Take 1 capsule (50 mg total) by mouth at bedtime. X 1 week, then BID x 1 week, then 50 mg in AM/100 mg at bedtime for 1 week- and 100 mg BID- for spasticity- failed Baclofen - and Zanaflex  too sedating at night 09/16/23   Lovorn, Megan, MD  fenofibrate micronized (LOFIBRA) 200 MG capsule Take 200 mg by mouth daily. Patient not taking: Reported on 09/16/2023 05/20/22   [provider]  hydrochlorothiazide  (HYDRODIURIL ) 25 MG tablet Take 25 mg by mouth daily.  10/20/17   [provider]  lisinopril  (PRINIVIL ,ZESTRIL ) 20 MG tablet Take 20 mg by mouth daily.  11/25/17   [provider]  tiZANidine  (ZANAFLEX ) 4 MG tablet Take 2 tablets  (8 mg total) by mouth at bedtime. For spasticity with Dantrolene  09/16/23   Lovorn, Megan, MD    Allergies: Patient has no known allergies.    Review of Systems  Cardiovascular:  Positive for chest pain.  All other systems reviewed and are negative.   Updated Vital Signs BP (!) 160/92   Pulse 77   Temp (!) 97.4 F (36.3 C)   Resp 15   Ht 5' 11 (1.803 m)   Wt 111.1 kg   SpO2 99%   BMI 34.17 kg/m   Physical Exam Vitals and nursing note reviewed.  Constitutional:      Appearance: He is well-developed.  HENT:     Head: Normocephalic and atraumatic.   Cardiovascular:     Rate and Rhythm: Normal rate and regular rhythm.     Heart sounds: No murmur heard. Pulmonary:     Effort: Pulmonary effort is normal. No respiratory distress.     Breath sounds: Normal breath sounds.  Abdominal:     Palpations: Abdomen is soft.     Tenderness: There is no abdominal tenderness. There is no guarding or rebound.     Comments: Soft umbilical hernia   Musculoskeletal:        General: No tenderness.     Comments: Trace edema to BLE   Skin:    General: Skin is warm and dry.   Neurological:     Mental  Status: He is alert and oriented to person, place, and time.   Psychiatric:        Behavior: Behavior normal.     (all labs ordered are listed, but only abnormal results are displayed) Labs Reviewed  BASIC METABOLIC PANEL WITH GFR - Abnormal; Notable for the following components:      Result Value   Glucose, Bld 102 (*)    All other components within normal limits  CBC  TROPONIN T, HIGH SENSITIVITY  TROPONIN T, HIGH SENSITIVITY    EKG: EKG Interpretation Date/Time:  Friday November 04 2023 21:20:01 EDT Ventricular Rate:  78 PR Interval:  162 QRS Duration:  85 QT Interval:  375 QTC Calculation: 428 R Axis:   41  Text Interpretation: Sinus rhythm Ventricular premature complex Abnormal R-wave progression, early transition Minimal ST elevation, anterior leads Confirmed by Kelsey Patricia [phone]) on 11/04/2023 11:00:16 PM  Radiology: Lenell Query Chest 2 View Result Date: 11/04/2023 EXAM: 2 VIEW(S) XRAY OF THE CHEST 11/04/2023 09:53:00 PM COMPARISON: None available. CLINICAL HISTORY: Intermittent chest pain x2 days. FINDINGS: LUNGS AND PLEURA: No focal pulmonary opacity. No pulmonary edema. No pleural effusion. No pneumothorax. HEART AND MEDIASTINUM: No acute abnormality of the cardiac and mediastinal silhouettes. BONES AND SOFT TISSUES: Upper thoracic spine fixation hardware. Mild degenerative changes of the mid/lower thoracic spine. IMPRESSION: 1. No acute process. Electronically signed by: Zadie Herter MD 11/04/2023 10:15 PM EDT RP Workstation: [id number]    {Document cardiac monitor, telemetry assessment procedure when appropriate:32947} Procedures   Medications Ordered in the ED - No data to display    {Click here for ABCD2, HEART and other calculators REFRESH Note before signing:1}                              Medical Decision Making Amount and/or Complexity of Data Reviewed Labs: ordered. Radiology: ordered.   ***  {Document critical care time when appropriate  Document review of labs and clinical decision tools ie CHADS2VASC2, etc  Document your independent review of radiology images and any outside records  Document your discussion with family members, caretakers and with consultants  Document social determinants of health affecting pt's care  Document your decision making why or why not admission, treatments were needed:32947:::1}   Final diagnoses:  None    ED Discharge Orders     None

## 2023-11-29 ENCOUNTER — Ambulatory Visit

## 2023-11-29 VITALS — BP 151/77 | HR 82 | Ht 71.0 in | Wt 242.1 lb

## 2023-11-29 DIAGNOSIS — R600 Localized edema: Secondary | ICD-10-CM | POA: Diagnosis not present

## 2023-11-29 DIAGNOSIS — R072 Precordial pain: Secondary | ICD-10-CM | POA: Insufficient documentation

## 2023-11-29 DIAGNOSIS — E785 Hyperlipidemia, unspecified: Secondary | ICD-10-CM | POA: Diagnosis not present

## 2023-11-29 DIAGNOSIS — I10 Essential (primary) hypertension: Secondary | ICD-10-CM | POA: Diagnosis not present

## 2023-11-29 HISTORY — DX: Precordial pain: R07.2

## 2023-11-29 HISTORY — DX: Localized edema: R60.0

## 2023-11-29 MED ORDER — METOPROLOL TARTRATE 100 MG PO TABS: 100.0000 mg | ORAL_TABLET | Freq: Once | ORAL | 0 refills | Status: DC

## 2023-11-29 MED ORDER — ASPIRIN 81 MG PO TBEC: 81.0000 mg | DELAYED_RELEASE_TABLET | Freq: Every day | ORAL | 3 refills | Status: AC

## 2023-11-29 NOTE — Assessment & Plan Note (Signed)
 Well-controlled lipid panel last checked August 2024.  Continue atorvastatin  40 mg once daily

## 2023-11-29 NOTE — Assessment & Plan Note (Signed)
 Atypical symptoms but does have significant cardiac risk factors in the form of age, prediabetes, relative sedentary state and longstanding history of hypertension.  Will obtain echocardiogram for cardiac structure and function assessment.  Given his risk factors and limited functional status, proceed with cardiac CT to rule out any significant obstructive coronary artery disease, discussed the procedure with him at length and he is agreeable and understanding the use of contrast and radiation for the procedure.  Recommended he start taking aspirin  81 mg once daily.

## 2023-11-29 NOTE — Patient Instructions (Signed)
 Medication Instructions:  Your physician has recommended you make the following change in your medication:   START: Aspirin  81 mg daily  *If you need a refill on your cardiac medications before your next appointment, please call your pharmacy*  Lab Work: Your physician recommends that you return for lab work in:   Labs today: BMP  If you have labs (blood work) drawn today and your tests are completely normal, you will receive your results only by: MyChart Message (if you have MyChart) OR A paper copy in the mail If you have any lab test that is abnormal or we need to change your treatment, we will call you to review the results.  Testing/Procedures: Your physician has requested that you have an echocardiogram. Echocardiography is a painless test that uses sound waves to create images of your heart. It provides your doctor with information about the size and shape of your heart and how well your heart's chambers and valves are working. This procedure takes approximately one hour. There are no restrictions for this procedure. Please do NOT wear cologne, perfume, aftershave, or lotions (deodorant is allowed). Please arrive 15 minutes prior to your appointment time.  Please note: We ask at that you not bring children with you during ultrasound (echo/ vascular) testing. Due to room size and safety concerns, children are not allowed in the ultrasound rooms during exams. Our front office staff cannot provide observation of children in our lobby area while testing is being conducted. An adult accompanying a patient to their appointment will only be allowed in the ultrasound room at the discretion of the ultrasound technician under special circumstances. We apologize for any inconvenience.    Your cardiac CT will be scheduled at one of the below locations:   [facility] [address] [place], [place] [address] [phone]  OR  [facility] [address] [place], [place] [address] [phone]  OR   [facility] [address] [place], [place] [address] [phone]  OR   [facility] [address] [place], [place] [address] [phone]  OR   [facility] [address]  [place], [place] [address]  If scheduled at [facility], please arrive at the [facility] and Children's Entrance (Entrance C2) of [facility] 30 minutes prior to test start time. You can use the FREE valet parking offered at entrance C (encouraged to control the heart rate for the test)  Proceed to the [facility] Radiology Department (first floor) to check-in and test prep.   All radiology patients and guests should use entrance C2 at [facility], accessed from [facility], even though the hospital's physical address listed is [address]    If scheduled at the Heart and Vascular Tower at [address], please enter the parking lot using the [address] entrance and use the FREE valet service at the patient drop-off area. Enter the buidling and check-in with registration on the main floor.  If scheduled at [facility] or [facility], please arrive 15 mins early for check-in and test prep.  There is spacious parking and easy access to the radiology department from the [facility] entrance. Please enter here and check-in with the desk attendant.   If scheduled at [facility], please arrive 30 minutes early for check-in and test prep.  Please follow these instructions  carefully (unless otherwise directed):  An IV will be required for this test and Nitroglycerin will be given.  Hold all erectile dysfunction medications at least 3 days (72 hrs) prior to test. (Ie viagra, cialis, sildenafil, tadalafil, etc)   On the Night Before the Test: Be sure to  Drink plenty of water. Do not consume any caffeinated/decaffeinated beverages or chocolate 12 hours prior to your test. Do not take any antihistamines 12 hours prior to your test.  On the Day of the Test: Drink plenty of water until 1 hour prior to the test. Do not eat any food 1 hour prior to test. You may take your regular medications prior to the test.  Take metoprolol  (Lopressor ) two hours prior to test. If you take Hydrochlorothiazide  please HOLD on the morning of the test. Patients who wear a continuous glucose monitor MUST remove the device prior to scanning.       After the Test: Drink plenty of water. After receiving IV contrast, you may experience a mild flushed feeling. This is normal. On occasion, you may experience a mild rash up to 24 hours after the test. This is not dangerous. If this occurs, you can take Benadryl  25 mg, Zyrtec, Claritin, or Allegra and increase your fluid intake. (Patients taking Tikosyn should avoid Benadryl , and may take Zyrtec, Claritin, or Allegra) If you experience trouble breathing, this can be serious. If it is severe call 911 IMMEDIATELY. If it is mild, please call our office.  We will call to schedule your test 2-4 weeks out understanding that some insurance companies will need an authorization prior to the service being performed.   For more information and frequently asked questions, please visit our website : [email or web address]  For non-scheduling related questions, please contact the cardiac imaging nurse navigator should you have any questions/concerns: Cardiac Imaging Nurse Navigators Direct Office Dial: [phone]   For scheduling needs, including cancellations and rescheduling, please call [place], [phone].   Follow-Up: At [facility], you and your health needs are our priority.  As part of our continuing mission to provide you with exceptional heart care, our providers are all part of one team.  This  team includes your primary Cardiologist (physician) and Advanced Practice Providers or APPs (Physician Assistants and Nurse Practitioners) who all work together to provide you with the care you need, when you need it.  Your next appointment:   6 week(s)  Provider:   Alean Kobus, MD    We recommend signing up for the patient portal called MyChart.  Sign up information is provided on this After Visit Summary.  MyChart is used to connect with patients for Virtual Visits (Telemedicine).  Patients are able to view lab/test results, encounter notes, upcoming appointments, etc.  Non-urgent messages can be sent to your provider as well.   To learn more about what you can do with MyChart, go to [email or web address].   Other Instructions Low-Sodium Eating Plan Salt (sodium) helps you keep a healthy balance of fluids in your body. Too much sodium can raise your blood pressure. It can also cause fluid and waste to be held in your body. Your health care provider or dietitian may recommend a low-sodium eating plan if you have high blood pressure (hypertension), kidney disease, liver disease, or heart failure. Eating less sodium can help lower your blood pressure and reduce swelling. It can also protect your heart, liver, and kidneys. What are tips for following this plan? Reading food labels  Check food labels for the  amount of sodium per serving. If you eat more than one serving, you must multiply the listed amount by the number of servings. Choose foods with less than 140 milligrams (mg) of sodium per serving. Avoid foods with 300 mg of sodium or more per serving. Always check how much sodium is in a product, even if the label says unsalted or no salt added. Shopping  Buy products labeled as low-sodium or no salt added. Buy fresh foods. Avoid canned foods and pre-made or frozen meals. Avoid canned, cured, or processed meats. Buy breads that have less than 80 mg of sodium per  slice. Cooking  Eat more home-cooked food. Try to eat less restaurant, buffet, and fast food. Try not to add salt when you cook. Use salt-free seasonings or herbs instead of table salt or sea salt. Check with your provider or pharmacist before using salt substitutes. Cook with plant-based oils, such as canola, sunflower, or olive oil. Meal planning When eating at a restaurant, ask if your food can be made with less salt or no salt. Avoid dishes labeled as brined, pickled, cured, or smoked. Avoid dishes made with soy sauce, miso, or teriyaki sauce. Avoid foods that have monosodium glutamate (MSG) in them. MSG may be added to some restaurant food, sauces, soups, bouillon, and canned foods. Make meals that can be grilled, baked, poached, roasted, or steamed. These are often made with less sodium. General information Try to limit your sodium intake to 1,500-2,300 mg each day, or the amount told by your provider. What foods should I eat? Fruits Fresh, frozen, or canned fruit. Fruit juice. Vegetables Fresh or frozen vegetables. No salt added canned vegetables. No salt added tomato sauce and paste. Low-sodium or reduced-sodium tomato and vegetable juice. Grains Low-sodium cereals, such as oats, puffed wheat and rice, and shredded wheat. Low-sodium crackers. Unsalted rice. Unsalted pasta. Low-sodium bread. Whole grain breads and whole grain pasta. Meats and other proteins Fresh or frozen meat, poultry, seafood, and fish. These should have no added salt. Low-sodium canned tuna and salmon. Unsalted nuts. Dried peas, beans, and lentils without added salt. Unsalted canned beans. Eggs. Unsalted nut butters. Dairy Milk. Soy milk. Cheese that is naturally low in sodium, such as ricotta cheese, fresh mozzarella, or Swiss cheese. Low-sodium or reduced-sodium cheese. Cream cheese. Yogurt. Seasonings and condiments Fresh and dried herbs and spices. Salt-free seasonings. Low-sodium mustard and ketchup.  Sodium-free salad dressing. Sodium-free light mayonnaise. Fresh or refrigerated horseradish. Lemon juice. Vinegar. Other foods Homemade, reduced-sodium, or low-sodium soups. Unsalted popcorn and pretzels. Low-salt or salt-free chips. The items listed above may not be all the foods and drinks you can have. Talk to a dietitian to learn more. What foods should I avoid? Vegetables Sauerkraut, pickled vegetables, and relishes. Olives. [place] fries. Onion rings. Regular canned vegetables, except low-sodium or reduced-sodium items. Regular canned tomato sauce and paste. Regular tomato and vegetable juice. Frozen vegetables in sauces. Grains Instant hot cereals. Bread stuffing, pancake, and biscuit mixes. Croutons. Seasoned rice or pasta mixes. Noodle soup cups. Boxed or frozen macaroni and cheese. Regular salted crackers. Self-rising flour. Meats and other proteins Meat or fish that is salted, canned, smoked, spiced, or pickled. Precooked or cured meat, such as sausages or meat loaves. [place]. Ham. Pepperoni. Hot dogs. Corned beef. Chipped beef. Salt pork. Jerky. Pickled herring, anchovies, and sardines. Regular canned tuna. Salted nuts. Dairy Processed cheese and cheese spreads. Hard cheeses. Cheese curds. Blue cheese. Feta cheese. String cheese. Regular cottage cheese. Buttermilk. Canned milk. Fats and  oils Salted butter. Regular margarine. Ghee. Bacon fat. Seasonings and condiments Onion salt, garlic salt, seasoned salt, table salt, and sea salt. Canned and packaged gravies. Worcestershire sauce. Tartar sauce. Barbecue sauce. Teriyaki sauce. Soy sauce, including reduced-sodium soy sauce. Steak sauce. Fish sauce. Oyster sauce. Cocktail sauce. Horseradish that you find on the shelf. Regular ketchup and mustard. Meat flavorings and tenderizers. Bouillon cubes. Hot sauce. Pre-made or packaged marinades. Pre-made or packaged taco seasonings. Relishes. Regular salad dressings. Salsa. Other foods Salted  popcorn and pretzels. Corn chips and puffs. Potato and tortilla chips. Canned or dried soups. Pizza. Frozen entrees and pot pies. The items listed above may not be all the foods and drinks you should avoid. Talk to a dietitian to learn more. This information is not intended to replace advice given to you by your health care provider. Make sure you discuss any questions you have with your health care provider. Document Revised: 05/27/2022 Document Reviewed: 05/27/2022 Elsevier Patient Education  2024 Elsevier Inc.    Please keep a BP log for 2 weeks and send by [employer] or mail.                          Name and DOB__________________________ Dr. Madireddy [address] [place], [place] [address]  Blood Pressure Record Sheet To take your blood pressure, you will need a blood pressure machine. You can buy a blood pressure machine (blood pressure monitor) at your clinic, drug store, or online. When choosing one, consider: An automatic monitor that has an arm cuff. A cuff that wraps snugly around your upper arm. You should be able to fit only one finger between your arm and the cuff. A device that stores blood pressure reading results. Do not choose a monitor that measures your blood pressure from your wrist or finger. Follow your health care provider's instructions for how to take your blood pressure. To use this form: Get one reading in the morning (a.m.) 1-2 hours after you take any medicines. Get one reading in the evening (p.m.) before supper.   Blood pressure log Date: _______________________  a.m. _____________________(1st reading) HR___________            p.m. _____________________(2nd reading) HR__________  Date: _______________________  a.m. _____________________(1st reading) HR___________            p.m. _____________________(2nd reading) HR__________  Date: _______________________  a.m. _____________________(1st reading) HR___________            p.m.  _____________________(2nd reading) HR__________  Date: _______________________  a.m. _____________________(1st reading) HR___________            p.m. _____________________(2nd reading) HR__________  Date: _______________________  a.m. _____________________(1st reading) HR___________            p.m. _____________________(2nd reading) HR__________  Date: _______________________  a.m. _____________________(1st reading) HR___________            p.m. _____________________(2nd reading) HR__________  Date: _______________________  a.m. _____________________(1st reading) HR___________            p.m. _____________________(2nd reading) HR__________   This information is not intended to replace advice given to you by your health care provider. Make sure you discuss any questions you have with your health care provider. Document Revised: 08/29/2019 Document Reviewed: 08/29/2019 Elsevier Patient Education  2021 [employer].
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
## 2023-11-29 NOTE — Assessment & Plan Note (Signed)
 Suboptimal control today in the office. Target below 130/80 mmHg. Advised to monitor blood pressures at home regularly and maintain a log.  Continue current medications amlodipine  10 mg, hydrochlorothiazide  25 mg and lisinopril  20 mg once daily. If suboptimal at subsequent office visit on blood pressure log will titrate up medications.

## 2023-11-29 NOTE — Assessment & Plan Note (Signed)
 Likely related to uncontrolled blood pressure and dietary salt indiscretion.  Likely exaggerated due to his relatively sedentary state because of chronic back pain.  Advised salt restriction to below 2 g/day. Will obtain transthoracic echocardiogram to rule out any LV systolic dysfunction or diastolic dysfunction.  Advised to keep feet elevated when he is sedentary.

## 2023-12-08 DIAGNOSIS — K5900 Constipation, unspecified: Secondary | ICD-10-CM

## 2023-12-08 HISTORY — DX: Constipation, unspecified: K59.00

## 2023-12-26 ENCOUNTER — Telehealth (HOSPITAL_COMMUNITY): Payer: Self-pay | Admitting: Emergency Medicine

## 2023-12-26 NOTE — Telephone Encounter (Signed)
 Pt wishes to cancel CCTA - doesn't think he needs this test anymore  Camie Shutter RN Navigator Cardiac Imaging [facility] [phone] Office  [phone] Cell

## 2023-12-26 NOTE — Telephone Encounter (Signed)
 Attempted to call patient regarding upcoming cardiac CT appointment. Left message on voicemail with name and callback number Rockwell Alexandria RN Navigator Cardiac Imaging [facility] [phone] Office [phone] Cell

## 2023-12-27 ENCOUNTER — Ambulatory Visit (HOSPITAL_BASED_OUTPATIENT_CLINIC_OR_DEPARTMENT_OTHER): Admission: RE | Admit: 2023-12-27 | Source: Ambulatory Visit

## 2023-12-30 ENCOUNTER — Encounter: Attending: Physical Medicine and Rehabilitation | Admitting: Physical Medicine and Rehabilitation

## 2024-01-02 ENCOUNTER — Ambulatory Visit (HOSPITAL_BASED_OUTPATIENT_CLINIC_OR_DEPARTMENT_OTHER)

## 2024-01-03 DIAGNOSIS — R140 Abdominal distension (gaseous): Secondary | ICD-10-CM

## 2024-01-03 HISTORY — DX: Abdominal distension (gaseous): R14.0

## 2024-01-10 ENCOUNTER — Other Ambulatory Visit: Payer: Self-pay

## 2024-01-10 ENCOUNTER — Emergency Department (HOSPITAL_BASED_OUTPATIENT_CLINIC_OR_DEPARTMENT_OTHER)

## 2024-01-10 ENCOUNTER — Encounter (HOSPITAL_BASED_OUTPATIENT_CLINIC_OR_DEPARTMENT_OTHER): Payer: Self-pay

## 2024-01-10 ENCOUNTER — Emergency Department (HOSPITAL_BASED_OUTPATIENT_CLINIC_OR_DEPARTMENT_OTHER)
Admission: EM | Admit: 2024-01-10 | Discharge: 2024-01-10 | Disposition: A | Discharge status: Home / Self Care | Attending: Emergency Medicine | Admitting: Emergency Medicine

## 2024-01-10 DIAGNOSIS — Z8616 Personal history of COVID-19: Secondary | ICD-10-CM | POA: Diagnosis not present

## 2024-01-10 DIAGNOSIS — E119 Type 2 diabetes mellitus without complications: Secondary | ICD-10-CM | POA: Insufficient documentation

## 2024-01-10 DIAGNOSIS — K429 Umbilical hernia without obstruction or gangrene: Secondary | ICD-10-CM | POA: Insufficient documentation

## 2024-01-10 DIAGNOSIS — Z7982 Long term (current) use of aspirin: Secondary | ICD-10-CM | POA: Insufficient documentation

## 2024-01-10 DIAGNOSIS — Z79899 Other long term (current) drug therapy: Secondary | ICD-10-CM | POA: Diagnosis not present

## 2024-01-10 DIAGNOSIS — K5900 Constipation, unspecified: Secondary | ICD-10-CM | POA: Insufficient documentation

## 2024-01-10 DIAGNOSIS — I10 Essential (primary) hypertension: Secondary | ICD-10-CM | POA: Diagnosis not present

## 2024-01-10 DIAGNOSIS — R140 Abdominal distension (gaseous): Secondary | ICD-10-CM | POA: Diagnosis present

## 2024-01-10 HISTORY — PX: COLONOSCOPY: SHX174

## 2024-01-10 LAB — URINALYSIS, ROUTINE W REFLEX MICROSCOPIC
Bilirubin Urine: NEGATIVE
Glucose, UA: NEGATIVE mg/dL
Hgb urine dipstick: NEGATIVE
Ketones, ur: NEGATIVE mg/dL
Leukocytes,Ua: NEGATIVE
Nitrite: NEGATIVE
Protein, ur: NEGATIVE mg/dL
Specific Gravity, Urine: 1.02 (ref 1.005–1.030)
pH: 7.5 (ref 5.0–8.0)

## 2024-01-10 LAB — COMPREHENSIVE METABOLIC PANEL WITH GFR
ALT: 30 U/L (ref 0–44)
AST: 28 U/L (ref 15–41)
Albumin: 5.1 g/dL — ABNORMAL HIGH (ref 3.5–5.0)
Alkaline Phosphatase: 67 U/L (ref 38–126)
Anion gap: 15 (ref 5–15)
BUN: 10 mg/dL (ref 6–20)
CO2: 24 mmol/L (ref 22–32)
Calcium: 9.9 mg/dL (ref 8.9–10.3)
Chloride: 103 mmol/L (ref 98–111)
Creatinine, Ser: 0.74 mg/dL (ref 0.61–1.24)
GFR, Estimated: >60 mL/min (ref >60)
Glucose, Bld: 103 mg/dL — ABNORMAL HIGH (ref 70–99)
Potassium: 3.5 mmol/L (ref 3.5–5.1)
Sodium: 142 mmol/L (ref 135–145)
Total Bilirubin: 0.8 mg/dL (ref 0.0–1.2)
Total Protein: 8.8 g/dL — ABNORMAL HIGH (ref 6.5–8.1)

## 2024-01-10 LAB — LIPASE, BLOOD: Lipase: 30 U/L (ref 11–51)

## 2024-01-10 LAB — CBC
HCT: 43 % (ref 39.0–52.0)
Hemoglobin: 14.2 g/dL (ref 13.0–17.0)
MCH: 28.4 pg (ref 26.0–34.0)
MCHC: 33 g/dL (ref 30.0–36.0)
MCV: 86 fL (ref 80.0–100.0)
Platelets: 309 K/uL (ref 150–400)
RBC: 5 MIL/uL (ref 4.22–5.81)
RDW: 12.8 % (ref 11.5–15.5)
WBC: 5.8 K/uL (ref 4.0–10.5)
nRBC: 0 % (ref 0.0–0.2)

## 2024-01-10 MED ORDER — IOHEXOL 300 MG/ML  SOLN
125.0000 mL | Freq: Once | INTRAMUSCULAR | Status: AC | PRN
  Administered 2024-01-10: 125 mL via INTRAVENOUS
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
## 2024-01-10 NOTE — ED Triage Notes (Signed)
 Pt states that he had a colonoscopy yesterday and now he is feeling bloated. Stomach noted to be distended and taunt. States that he can't pass gas, has not had a bowel movement.   States that ne is having nausea. Notes 1 polyp on exam yesterday.

## 2024-01-10 NOTE — Discharge Instructions (Addendum)
 Take the MiraLAX  daily as discussed.  Follow-up with either your primary care doctor or your gastroenterologist if your symptoms are improving.  Return to the emergency room if you have any worsening symptoms.

## 2024-01-16 ENCOUNTER — Telehealth (HOSPITAL_COMMUNITY): Payer: Self-pay | Admitting: *Deleted

## 2024-01-16 NOTE — Telephone Encounter (Signed)
 Attempted to call patient regarding upcoming cardiac CT appointment. Left message on voicemail with name and callback number  Larey Brick RN Navigator Cardiac Imaging [facility] [phone] Office [phone] Cell

## 2024-01-17 ENCOUNTER — Ambulatory Visit (HOSPITAL_BASED_OUTPATIENT_CLINIC_OR_DEPARTMENT_OTHER): Admission: RE | Admit: 2024-01-17 | Source: Ambulatory Visit

## 2024-01-27 ENCOUNTER — Encounter (HOSPITAL_COMMUNITY): Payer: Self-pay

## 2024-01-30 DIAGNOSIS — R200 Anesthesia of skin: Secondary | ICD-10-CM

## 2024-01-30 HISTORY — DX: Anesthesia of skin: R20.0

## 2024-01-31 DIAGNOSIS — E538 Deficiency of other specified B group vitamins: Secondary | ICD-10-CM

## 2024-01-31 HISTORY — DX: Deficiency of other specified B group vitamins: E53.8

## 2024-02-01 ENCOUNTER — Ambulatory Visit (HOSPITAL_BASED_OUTPATIENT_CLINIC_OR_DEPARTMENT_OTHER)

## 2024-02-01 ENCOUNTER — Ambulatory Visit

## 2024-02-13 ENCOUNTER — Ambulatory Visit

## 2024-02-13 NOTE — Progress Notes (Deleted)
 Cardiology Consultation:    Date:  02/13/2024   ID:  David Wise, DOB 1970-06-25, MRN [id number]  PCP:  David Seltzer, MD  Cardiologist:  Alean SAUNDERS Alvis Edgell, MD   Referring MD: David Seltzer, MD   No chief complaint on file.    ASSESSMENT AND PLAN:   Mr. David Wise 53 year old male Problem List Items Addressed This Visit   None     History of Present Illness:    David Wise is a 53 y.o. male who is being seen today for follow-up visit. PCP is David Seltzer, MD. Last visit with me in the office was 11/29/2023.  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   Has history of prediabetes [hemoglobin A1c 6.26 July 2023], hypertension, hyperlipidemia, chronic back pain with prior back surgery, remains on oxycodone  for pain control. Non-smoker.  Drinks alcohol occasionally.  Evaluated for atypical chest pain and bilateral lower extremity edema and uncontrolled blood pressures at last visit.  Cardiac CT coronary angiogram and echocardiogram were requested, not yet completed.  Past Medical History:  Diagnosis Date   COVID-19 04/2020   Diabetes mellitus without complication (HCC)    High cholesterol    Hypertension     Past Surgical History:  Procedure Laterality Date   ANKLE SURGERY Left    BACK SURGERY     COLONOSCOPY  01/10/2024   [facility]     LAMINECTOMY WITH POSTERIOR LATERAL ARTHRODESIS LEVEL 2 N/A 02/03/2021   Procedure: T3-4 laminectomy, transpedicular discectomy, T3-4 possible T3-5 Posterior instrumented fusion;  Surgeon: Cheryle Debby LABOR, MD;  Location: [facility] OR;  Service: Neurosurgery;  Laterality: N/A;    Current Medications: No outpatient medications have been marked as taking for the 02/13/24 encounter (Appointment) with Gianne Shugars, Alean SAUNDERS, MD.     Allergies:   Patient has no known allergies.   Social History   Socioeconomic History   Marital status: Single    Spouse name: Not on file   Number of children: Not on file   Years of education: Not on file   Highest  education level: Not on file  Occupational History   Not on file  Tobacco Use   Smoking status: Never   Smokeless tobacco: Never  Vaping Use   Vaping status: Never Used  Substance and Sexual Activity   Alcohol use: Yes    Alcohol/week: 3.0 standard drinks of alcohol    Types: 3 Cans of beer per week    Comment: moderate    Drug use: No   Sexual activity: Not on file  Other Topics Concern   Not on file  Social History Narrative   Not on file   Social Drivers of Health   Financial Resource Strain: Not on file  Food Insecurity: No Food Insecurity (03/09/2021)   Received from [facility]   Hunger Vital Sign    Within the past 12 months, you worried that your food would run out before you got the money to buy more.: Never true    Within the past 12 months, the food you bought just didn't last and you didn't have money to get more.: Never true  Transportation Needs: Not on file  Physical Activity: Not on file  Stress: Not on file  Social Connections: Unknown (10/05/2021)   Received from [facility]   Social Network    Social Network: Not on file     Family History: The patient's family history includes Hypertension in an other family member. ROS:   Please see the history of present  illness.    All 14 point review of systems negative except as described per history of present illness.  EKGs/Labs/Other Studies Reviewed:    The following studies were reviewed today:   EKG:       Recent Labs: 01/10/2024: ALT 30; BUN 10; Creatinine, Ser 0.74; Hemoglobin 14.2; Platelets 309; Potassium 3.5; Sodium 142  Recent Lipid Panel    Component Value Date/Time   TRIG 102 06/04/2019 1235    Physical Exam:    VS:  There were no vitals taken for this visit.    Wt Readings from Last 3 Encounters:  01/10/24 240 lb (108.9 kg)  11/29/23 242 lb 1.9 oz (109.8 kg)  11/04/23 245 lb (111.1 kg)     GENERAL:  Well nourished, well developed in no acute distress NECK: No JVD; No  carotid bruits CARDIAC: RRR, S1 and S2 present, no murmurs, no rubs, no gallops CHEST:  Clear to auscultation without rales, wheezing or rhonchi  Extremities: No pitting pedal edema. Pulses bilaterally symmetric with radial 2+ and dorsalis pedis 2+ NEUROLOGIC:  Alert and oriented x 3  Medication Adjustments/Labs and Tests Ordered: Current medicines are reviewed at length with the patient today.  Concerns regarding medicines are outlined above.  No orders of the defined types were placed in this encounter.  No orders of the defined types were placed in this encounter.   Signed, Alean jess Kobus, MD, MPH, [facility]. 02/13/2024 12:51 PM    [facility]

## 2024-02-28 ENCOUNTER — Encounter (HOSPITAL_COMMUNITY): Payer: Self-pay

## 2024-06-13 ENCOUNTER — Emergency Department (HOSPITAL_BASED_OUTPATIENT_CLINIC_OR_DEPARTMENT_OTHER)

## 2024-06-13 ENCOUNTER — Other Ambulatory Visit: Payer: Self-pay

## 2024-06-13 ENCOUNTER — Encounter (HOSPITAL_BASED_OUTPATIENT_CLINIC_OR_DEPARTMENT_OTHER): Payer: Self-pay | Admitting: Emergency Medicine

## 2024-06-13 ENCOUNTER — Emergency Department (HOSPITAL_BASED_OUTPATIENT_CLINIC_OR_DEPARTMENT_OTHER)
Admission: EM | Admit: 2024-06-13 | Discharge: 2024-06-13 | Disposition: A | Discharge status: Home / Self Care | Attending: Emergency Medicine | Admitting: Emergency Medicine

## 2024-06-13 DIAGNOSIS — I10 Essential (primary) hypertension: Secondary | ICD-10-CM | POA: Diagnosis not present

## 2024-06-13 DIAGNOSIS — Z7982 Long term (current) use of aspirin: Secondary | ICD-10-CM | POA: Diagnosis not present

## 2024-06-13 DIAGNOSIS — E119 Type 2 diabetes mellitus without complications: Secondary | ICD-10-CM | POA: Insufficient documentation

## 2024-06-13 DIAGNOSIS — R079 Chest pain, unspecified: Secondary | ICD-10-CM | POA: Insufficient documentation

## 2024-06-13 DIAGNOSIS — Z79899 Other long term (current) drug therapy: Secondary | ICD-10-CM | POA: Insufficient documentation

## 2024-06-13 LAB — BASIC METABOLIC PANEL WITH GFR
Anion gap: 11 (ref 5–15)
BUN: 11 mg/dL (ref 6–20)
CO2: 28 mmol/L (ref 22–32)
Calcium: 10.1 mg/dL (ref 8.9–10.3)
Chloride: 102 mmol/L (ref 98–111)
Creatinine, Ser: 0.81 mg/dL (ref 0.61–1.24)
GFR, Estimated: >60 mL/min
Glucose, Bld: 121 mg/dL — ABNORMAL HIGH (ref 70–99)
Potassium: 3.6 mmol/L (ref 3.5–5.1)
Sodium: 141 mmol/L (ref 135–145)

## 2024-06-13 LAB — TROPONIN T, HIGH SENSITIVITY: Troponin T High Sensitivity: 10 ng/L (ref 0–19)

## 2024-06-13 LAB — CBC
HCT: 42 % (ref 39.0–52.0)
Hemoglobin: 13.5 g/dL (ref 13.0–17.0)
MCH: 27.3 pg (ref 26.0–34.0)
MCHC: 32.1 g/dL (ref 30.0–36.0)
MCV: 85 fL (ref 80.0–100.0)
Platelets: 243 K/uL (ref 150–400)
RBC: 4.92 MIL/uL (ref 4.22–5.81)
RDW: 13.4 % (ref 11.5–15.5)
WBC: 5.9 K/uL (ref 4.0–10.5)

## 2024-06-13 MED ORDER — OMEPRAZOLE 20 MG PO CPDR: 20.0000 mg | DELAYED_RELEASE_CAPSULE | Freq: Every day | ORAL | 0 refills | Status: AC

## 2024-06-13 NOTE — ED Notes (Signed)
 Patient transferred from waiting room to ED treatment room. Assuming pt care at this time.

## 2024-06-13 NOTE — ED Triage Notes (Signed)
 Pt c/o intermittent chest pain last night, indigestion over weekend.   Denies n/v/diaphoresis.

## 2024-06-13 NOTE — Discharge Instructions (Signed)
 Make an appointment to follow-up with the cardiologist as discussed.  Start taking the omeprazole .  Return to the emergency room if you have any worsening symptoms.

## 2024-06-13 NOTE — ED Notes (Signed)
Patient transported to imaging at this time.

## 2024-06-13 NOTE — ED Notes (Signed)
 Reviewed discharge instructions, follow up and medications with pt. Pt states understanding. Pain has resolved at discharge. No distress noted. Ambulatory at discharge with family

## 2024-06-13 NOTE — ED Provider Notes (Signed)
 " [place] [facility] Provider Note   CSN: [id number] Arrival date & time: 06/13/24  1608     Patient presents with: Chest Pain   David Wise is a 54 y.o. male.   Patient is a 54 year old male who presents with chest pain.  He has a history of hypertension, chronic back pain, diabetes, hyperlipidemia.  He said that 3 days ago he had indigestion.  He was not able to burp and he drink some ginger ale and then was able to burp.  He was fine until last night when he developed some chest pain.  He described as a burning pain to his left chest.  He denies associated symptoms.  No pleuritic symptoms.  No exertional symptoms.  No nausea or vomiting.  No shortness of breath.  No diaphoresis.  Pain is not related to eating.  Not related to breathing.  He has had similar symptoms in the past and has been to the ED he says in the past.  He reports that he was given referral to cardiology but canceled the appointment.  No known history of prior heart disease.       Prior to Admission medications  Medication Sig Start Date End Date Taking? Authorizing Provider  omeprazole  (PRILOSEC) 20 MG capsule Take 1 capsule (20 mg total) by mouth daily. 06/13/24  Yes Lenor Hollering, MD  amLODipine  (NORVASC ) 10 MG tablet Take 1 tablet by mouth daily. 08/09/23   [provider]  aspirin  EC 81 MG tablet Take 1 tablet (81 mg total) by mouth daily. Swallow whole. 11/29/23   Madireddy, Alean SAUNDERS, MD  atorvastatin  (LIPITOR) 40 MG tablet Take 1 tablet by mouth daily. 08/01/23   [provider]  hydrochlorothiazide  (HYDRODIURIL ) 25 MG tablet Take 25 mg by mouth daily.  10/20/17   [provider]  lisinopril  (PRINIVIL ,ZESTRIL ) 20 MG tablet Take 20 mg by mouth daily.  11/25/17   [provider]  metoprolol  tartrate (LOPRESSOR ) 100 MG tablet Take 1 tablet (100 mg total) by mouth once for 1 dose. Please take this medication 2 hours before CT 11/29/23 11/29/23   Madireddy, Alean SAUNDERS, MD  oxyCODONE -acetaminophen  (PERCOCET) 10-325 MG tablet Take 1 tablet by mouth 4 (four) times daily as needed for pain. 11/17/23   [provider]  tiZANidine  (ZANAFLEX ) 4 MG tablet Take 2 tablets (8 mg total) by mouth at bedtime. For spasticity with Dantrolene  09/16/23   Lovorn, Megan, MD    Allergies: Patient has no known allergies.    Review of Systems  Constitutional:  Negative for chills, diaphoresis, fatigue and fever.  HENT:  Negative for congestion, rhinorrhea and sneezing.   Eyes: Negative.   Respiratory:  Negative for cough and shortness of breath.   Cardiovascular:  Positive for chest pain. Negative for leg swelling.  Gastrointestinal:  Negative for abdominal pain, diarrhea, nausea and vomiting.       Chronic bloating and constipation  Genitourinary:  Negative for difficulty urinating, flank pain and frequency.  Musculoskeletal:  Negative for arthralgias and back pain.  Skin:  Negative for rash.  Neurological:  Negative for dizziness, speech difficulty, weakness, numbness and headaches.    Updated Vital Signs BP (!) 156/82 (BP Location: Right Arm)   Pulse 89   Temp 97.6 F (36.4 C) (Oral)   Resp 18   Ht 5' 11 (1.803 m)   Wt 108.9 kg   SpO2 100%   BMI 33.47 kg/m   Physical Exam Constitutional:  Appearance: He is well-developed.  HENT:     Head: Normocephalic and atraumatic.  Eyes:     Pupils: Pupils are equal, round, and reactive to light.  Cardiovascular:     Rate and Rhythm: Normal rate and regular rhythm.     Heart sounds: Normal heart sounds.  Pulmonary:     Effort: Pulmonary effort is normal. No respiratory distress.     Breath sounds: Normal breath sounds. No wheezing or rales.  Chest:     Chest wall: No tenderness.  Abdominal:     General: Bowel sounds are normal.     Palpations: Abdomen is soft.     Tenderness: There is no abdominal tenderness. There is no guarding or rebound.  Musculoskeletal:        General:  Normal range of motion.     Cervical back: Normal range of motion and neck supple.     Comments: No significant edema or calf tenderness  Lymphadenopathy:     Cervical: No cervical adenopathy.  Skin:    General: Skin is warm and dry.     Findings: No rash.  Neurological:     Mental Status: He is alert and oriented to person, place, and time.     (all labs ordered are listed, but only abnormal results are displayed) Labs Reviewed  BASIC METABOLIC PANEL WITH GFR - Abnormal; Notable for the following components:      Result Value   Glucose, Bld 121 (*)    All other components within normal limits  CBC  TROPONIN T, HIGH SENSITIVITY    EKG: EKG Interpretation Date/Time:  Wednesday June 13 2024 16:19:22 EST Ventricular Rate:  86 PR Interval:  160 QRS Duration:  86 QT Interval:  359 QTC Calculation: 430 R Axis:   36  Text Interpretation: Sinus rhythm Ventricular premature complex LAE, consider biatrial enlargement Abnormal R-wave progression, early transition Borderline T abnormalities, inferior leads similar to prior EKG Confirmed by Lenor Hollering [phone]) on 06/13/2024 4:20:51 PM  Radiology: [facility] Chest 2 View Result Date: 06/13/2024 CLINICAL DATA:  Intermittent chest pain. EXAM: DG CHEST 2V COMPARISON:  November 04, 2023 FINDINGS: The heart size and mediastinal contours are within normal limits. Both lungs are clear. Postoperative changes are seen within the upper thoracic spine. The visualized skeletal structures are otherwise unremarkable. IMPRESSION: No active cardiopulmonary disease. Electronically Signed   By: Suzen Dials M.D.   On: 06/13/2024 16:45     Procedures   Medications Ordered in the ED - No data to display                                  Medical Decision Making Amount and/or Complexity of Data Reviewed Labs: ordered. Radiology: ordered.  Risk Prescription drug management.   This patient presents to the ED for concern of chest pain, this involves  an extensive number of treatment options, and is a complaint that carries with it a high risk of complications and morbidity.  I considered the following differential and admission for this acute, potentially life threatening condition.  The differential diagnosis includes musculoskeletal pain, GERD, ACS, aortic dissection, PE, gallbladder disease  MDM:    Patient is a 54 year old who presents with chest pain that occurred last night.  He has not had any since that time.  No associated symptoms.  No shortness of breath or other symptoms that would be more concerning for PE.  No current pain or  other description of the pain that would be more concerning for aortic dissection.  His EKG does not show any ischemic changes.  His troponin is negative.  He only had 1 troponin as his last pain was last night.  Chest x-ray does not show any acute abnormality.  No pneumothorax or pneumonia.  Other labs are nonconcerning.  He has not had any exertional symptoms or any ongoing chest pain.  No current abdominal pain or tenderness that would indicate an abdominal etiology.  He was discharged home in good condition.  Will start him on Prilosec.  Discussed following up with cardiology.  Return precautions were given.  (Labs, imaging, consults)  Labs: I Ordered, and personally interpreted labs.  The pertinent results include: Normal troponin, minimally elevated glucose, otherwise nonconcerning  Imaging Studies ordered: I ordered imaging studies including chest x-ray I independently visualized and interpreted imaging. I agree with the radiologist interpretation  Additional history obtained from  .  External records from outside source obtained and reviewed including prior notes  Cardiac Monitoring: The patient was maintained on a cardiac monitor.  If on the cardiac monitor, I personally viewed and interpreted the cardiac monitored which showed an underlying rhythm of: Sinus rhythm  Reevaluation: After the  interventions noted above, I reevaluated the patient and found that they have :improved  Social Determinants of Health:    Disposition: Discharged to home  Co morbidities that complicate the patient evaluation  Past Medical History:  Diagnosis Date   COVID-19 04/2020   Diabetes mellitus without complication (HCC)    High cholesterol    Hypertension      Medicines Meds ordered this encounter  Medications   omeprazole  (PRILOSEC) 20 MG capsule    Sig: Take 1 capsule (20 mg total) by mouth daily.    Dispense:  30 capsule    Refill:  0    I have reviewed the patients home medicines and have made adjustments as needed  Problem List / ED Course: Problem List Items Addressed This Visit   None Visit Diagnoses       Nonspecific chest pain    -  Primary                Final diagnoses:  Nonspecific chest pain    ED Discharge Orders          Ordered    omeprazole  (PRILOSEC) 20 MG capsule  Daily        06/13/24 1805               Lenor Hollering, MD 06/13/24 1808  "

## 2024-06-14 ENCOUNTER — Ambulatory Visit

## 2024-06-14 VITALS — BP 100/64 | HR 74 | Ht 71.0 in | Wt 243.8 lb

## 2024-06-14 DIAGNOSIS — E119 Type 2 diabetes mellitus without complications: Secondary | ICD-10-CM | POA: Insufficient documentation

## 2024-06-14 DIAGNOSIS — E785 Hyperlipidemia, unspecified: Secondary | ICD-10-CM | POA: Insufficient documentation

## 2024-06-14 DIAGNOSIS — I10 Essential (primary) hypertension: Secondary | ICD-10-CM | POA: Diagnosis not present

## 2024-06-14 DIAGNOSIS — E782 Mixed hyperlipidemia: Secondary | ICD-10-CM | POA: Diagnosis not present

## 2024-06-14 DIAGNOSIS — R072 Precordial pain: Secondary | ICD-10-CM | POA: Diagnosis not present
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
## 2024-06-14 NOTE — Assessment & Plan Note (Signed)
 Well-controlled. Continue with current medications amlodipine , lisinopril , hydrochlorothiazide .

## 2024-06-14 NOTE — Patient Instructions (Addendum)
 Medication Instructions:  Your physician recommends that you continue on your current medications as directed. Please refer to the Current Medication list given to you today.  *If you need a refill on your cardiac medications before your next appointment, please call your pharmacy*  Lab Work: Your physician recommends that you return for lab work in:   Labs today: BMP  If you have labs (blood work) drawn today and your tests are completely normal, you will receive your results only by: MyChart Message (if you have MyChart) OR A paper copy in the mail If you have any lab test that is abnormal or we need to change your treatment, we will call you to review the results.  Testing/Procedures: None   Follow-Up: At [facility], you and your health needs are our priority.  As part of our continuing mission to provide you with exceptional heart care, our providers are all part of one team.  This team includes your primary Cardiologist (physician) and Advanced Practice Providers or APPs (Physician Assistants and Nurse Practitioners) who all work together to provide you with the care you need, when you need it.  Your next appointment:   2 month(s)  Provider:   Alean Kobus, MD    We recommend signing up for the patient portal called MyChart.  Sign up information is provided on this After Visit Summary.  MyChart is used to connect with patients for Virtual Visits (Telemedicine).  Patients are able to view lab/test results, encounter notes, upcoming appointments, etc.  Non-urgent messages can be sent to your provider as well.   To learn more about what you can do with MyChart, go to [email or web address].   Other Instructions None

## 2024-06-14 NOTE — Assessment & Plan Note (Signed)
 Lipid panel from 01/30/2024 triglycerides 99, total cholesterol 80, HDL 43 and LDL 19. Hemoglobin A1c 6.6.  Well-optimized. Continue atorvastatin  40 mg once daily.

## 2024-06-14 NOTE — Assessment & Plan Note (Signed)
 Atypical symptoms. Significant cardiovascular risk factors. Previously recommended further evaluation with echocardiogram and cardiac CT. He has not scheduled these. With history of left ankle fracture 3 different times, not ideal candidate for treadmill EKG stress test.  Proceed with echocardiogram and cardiac CT.  He does have isolated PVCs on EKG if echo and cardiac CT are unremarkable we will consider rhythm assessment with heart monitor if he continues to be symptomatic.

## 2024-06-15 LAB — BASIC METABOLIC PANEL WITH GFR
BUN/Creatinine Ratio: 12 (ref 9–20)
BUN: 11 mg/dL (ref 6–24)
CO2: 24 mmol/L (ref 20–29)
Calcium: 9.8 mg/dL (ref 8.7–10.2)
Chloride: 101 mmol/L (ref 96–106)
Creatinine, Ser: 0.9 mg/dL (ref 0.76–1.27)
Glucose: 91 mg/dL (ref 70–99)
Potassium: 3.9 mmol/L (ref 3.5–5.2)
Sodium: 142 mmol/L (ref 134–144)
eGFR: 102 mL/min/1.73

## 2024-06-25 ENCOUNTER — Telehealth: Payer: Self-pay

## 2024-06-25 ENCOUNTER — Telehealth (HOSPITAL_COMMUNITY): Payer: Self-pay | Admitting: Emergency Medicine

## 2024-06-25 ENCOUNTER — Other Ambulatory Visit: Payer: Self-pay

## 2024-06-25 DIAGNOSIS — R072 Precordial pain: Secondary | ICD-10-CM

## 2024-06-25 MED ORDER — METOPROLOL TARTRATE 100 MG PO TABS: 100.0000 mg | ORAL_TABLET | Freq: Once | ORAL | 0 refills | Status: AC

## 2024-06-25 NOTE — Telephone Encounter (Signed)
 Received the following message from Brittany Lynch below:  He has been r/s. He might need a call he seem confused about this scan. Im going to close his other referral.  Called the patient and he stated that his test had been rescheduled for tomorrow morning and he already had his blood drawn and was going to the pharmacy to pick up his Metoprolol  medication to take 2 hours before his cardiac CT. Patient stated that he had everything he needed for his test. Patient had no further questions at this time.

## 2024-06-25 NOTE — Telephone Encounter (Signed)
 Reaching out to patient to offer assistance regarding upcoming cardiac imaging study; pt verbalizes understanding of appt date/time, parking situation and where to check in, pre-test NPO status and medications ordered, and verified current allergies; name and call back number provided for further questions should they arise Rockwell Alexandria RN Navigator Cardiac Imaging [facility] [phone] office [phone] cell

## 2024-06-26 ENCOUNTER — Ambulatory Visit (HOSPITAL_BASED_OUTPATIENT_CLINIC_OR_DEPARTMENT_OTHER): Admission: RE | Admit: 2024-06-26 | Discharge: 2024-06-26 | Disposition: A | Source: Ambulatory Visit

## 2024-06-26 DIAGNOSIS — R072 Precordial pain: Secondary | ICD-10-CM

## 2024-06-26 MED ORDER — NITROGLYCERIN 0.4 MG SL SUBL
0.8000 mg | SUBLINGUAL_TABLET | Freq: Once | SUBLINGUAL | Status: AC
  Administered 2024-06-26: 0.8 mg via SUBLINGUAL

## 2024-06-26 MED ORDER — IOHEXOL 350 MG/ML SOLN
100.0000 mL | Freq: Once | INTRAVENOUS | Status: AC | PRN
  Administered 2024-06-26: 95 mL via INTRAVENOUS

## 2024-06-28 ENCOUNTER — Ambulatory Visit: Payer: Self-pay

## 2024-06-28 DIAGNOSIS — I2510 Atherosclerotic heart disease of native coronary artery without angina pectoris: Secondary | ICD-10-CM | POA: Insufficient documentation

## 2024-07-09 ENCOUNTER — Ambulatory Visit (HOSPITAL_BASED_OUTPATIENT_CLINIC_OR_DEPARTMENT_OTHER)

## 2024-07-16 ENCOUNTER — Ambulatory Visit (HOSPITAL_BASED_OUTPATIENT_CLINIC_OR_DEPARTMENT_OTHER)

## 2024-08-14 ENCOUNTER — Ambulatory Visit
